# Patient Record
Sex: Female | Born: 1960 | Race: White | Hispanic: No | Marital: Single | Smoking: Never smoker
Health system: Southern US, Community
[De-identification: ages and names within clinical notes are randomized; demographics above are authoritative.]

## PROBLEM LIST (undated history)

## (undated) DIAGNOSIS — M1990 Unspecified osteoarthritis, unspecified site: Secondary | ICD-10-CM

## (undated) DIAGNOSIS — M419 Scoliosis, unspecified: Secondary | ICD-10-CM

## (undated) DIAGNOSIS — J329 Chronic sinusitis, unspecified: Secondary | ICD-10-CM

## (undated) DIAGNOSIS — M8580 Other specified disorders of bone density and structure, unspecified site: Secondary | ICD-10-CM

## (undated) DIAGNOSIS — R87619 Unspecified abnormal cytological findings in specimens from cervix uteri: Secondary | ICD-10-CM

## (undated) DIAGNOSIS — M898X8 Other specified disorders of bone, other site: Secondary | ICD-10-CM

## (undated) DIAGNOSIS — T7840XA Allergy, unspecified, initial encounter: Secondary | ICD-10-CM

## (undated) DIAGNOSIS — G2581 Restless legs syndrome: Secondary | ICD-10-CM

## (undated) DIAGNOSIS — B019 Varicella without complication: Secondary | ICD-10-CM

## (undated) HISTORY — DX: Unspecified abnormal cytological findings in specimens from cervix uteri: R87.619

## (undated) HISTORY — DX: Chronic sinusitis, unspecified: J32.9

## (undated) HISTORY — DX: Unspecified osteoarthritis, unspecified site: M19.90

## (undated) HISTORY — DX: Other specified disorders of bone, other site: M89.8X8

## (undated) HISTORY — DX: Varicella without complication: B01.9

## (undated) HISTORY — DX: Restless legs syndrome: G25.81

## (undated) HISTORY — DX: Scoliosis, unspecified: M41.9

## (undated) HISTORY — DX: Allergy, unspecified, initial encounter: T78.40XA

## (undated) HISTORY — DX: Other specified disorders of bone density and structure, unspecified site: M85.80

---

## 1981-06-11 HISTORY — PX: FINGER FRACTURE SURGERY: SHX638

## 2003-04-22 ENCOUNTER — Ambulatory Visit (HOSPITAL_COMMUNITY): Admission: RE | Admit: 2003-04-22 | Discharge: 2003-04-22 | Payer: Self-pay

## 2003-11-29 ENCOUNTER — Ambulatory Visit (HOSPITAL_COMMUNITY): Admission: RE | Admit: 2003-11-29 | Discharge: 2003-11-29 | Payer: Self-pay | Admitting: Internal Medicine

## 2006-06-11 DIAGNOSIS — R87619 Unspecified abnormal cytological findings in specimens from cervix uteri: Secondary | ICD-10-CM

## 2006-06-11 HISTORY — PX: DILATION AND CURETTAGE OF UTERUS: SHX78

## 2006-06-11 HISTORY — DX: Unspecified abnormal cytological findings in specimens from cervix uteri: R87.619

## 2006-09-19 ENCOUNTER — Ambulatory Visit (HOSPITAL_COMMUNITY): Admission: RE | Admit: 2006-09-19 | Discharge: 2006-09-19 | Payer: Self-pay | Admitting: Internal Medicine

## 2006-11-14 ENCOUNTER — Ambulatory Visit (HOSPITAL_COMMUNITY): Admission: RE | Admit: 2006-11-14 | Discharge: 2006-11-14 | Payer: Self-pay | Admitting: Orthopaedic Surgery

## 2007-12-18 ENCOUNTER — Ambulatory Visit (HOSPITAL_COMMUNITY): Admission: RE | Admit: 2007-12-18 | Discharge: 2007-12-18 | Payer: Self-pay | Admitting: Specialist

## 2007-12-29 ENCOUNTER — Ambulatory Visit (HOSPITAL_COMMUNITY): Admission: RE | Admit: 2007-12-29 | Discharge: 2007-12-29 | Payer: Self-pay | Admitting: Specialist

## 2010-04-17 ENCOUNTER — Emergency Department (HOSPITAL_COMMUNITY): Admission: EM | Admit: 2010-04-17 | Discharge: 2010-04-17 | Payer: Self-pay | Admitting: Emergency Medicine

## 2011-08-30 ENCOUNTER — Other Ambulatory Visit (HOSPITAL_COMMUNITY): Payer: Self-pay | Admitting: Specialist

## 2011-08-30 DIAGNOSIS — Z139 Encounter for screening, unspecified: Secondary | ICD-10-CM

## 2011-09-11 ENCOUNTER — Ambulatory Visit (HOSPITAL_COMMUNITY)
Admission: RE | Admit: 2011-09-11 | Discharge: 2011-09-11 | Disposition: A | Discharge status: Home / Self Care | Payer: BC Managed Care – PPO | Source: Ambulatory Visit | Attending: Specialist | Admitting: Specialist

## 2011-09-11 DIAGNOSIS — Z1231 Encounter for screening mammogram for malignant neoplasm of breast: Secondary | ICD-10-CM | POA: Insufficient documentation

## 2011-09-11 DIAGNOSIS — Z139 Encounter for screening, unspecified: Secondary | ICD-10-CM

## 2012-10-17 ENCOUNTER — Other Ambulatory Visit (HOSPITAL_COMMUNITY): Payer: Self-pay | Admitting: Orthopaedic Surgery

## 2012-10-17 DIAGNOSIS — M545 Low back pain: Secondary | ICD-10-CM

## 2012-10-21 ENCOUNTER — Ambulatory Visit (HOSPITAL_COMMUNITY)
Admission: RE | Admit: 2012-10-21 | Discharge: 2012-10-21 | Disposition: A | Discharge status: Home / Self Care | Payer: BC Managed Care – PPO | Source: Ambulatory Visit | Attending: Orthopaedic Surgery | Admitting: Orthopaedic Surgery

## 2012-10-21 DIAGNOSIS — M5126 Other intervertebral disc displacement, lumbar region: Secondary | ICD-10-CM | POA: Insufficient documentation

## 2012-10-21 DIAGNOSIS — M545 Low back pain, unspecified: Secondary | ICD-10-CM | POA: Insufficient documentation

## 2012-12-04 ENCOUNTER — Other Ambulatory Visit (HOSPITAL_COMMUNITY): Payer: Self-pay | Admitting: Specialist

## 2012-12-04 DIAGNOSIS — Z139 Encounter for screening, unspecified: Secondary | ICD-10-CM

## 2012-12-08 ENCOUNTER — Ambulatory Visit (HOSPITAL_COMMUNITY)
Admission: RE | Admit: 2012-12-08 | Discharge: 2012-12-08 | Disposition: A | Discharge status: Home / Self Care | Payer: BC Managed Care – PPO | Source: Ambulatory Visit | Attending: Specialist | Admitting: Specialist

## 2012-12-08 DIAGNOSIS — Z1231 Encounter for screening mammogram for malignant neoplasm of breast: Secondary | ICD-10-CM | POA: Insufficient documentation

## 2012-12-08 DIAGNOSIS — Z139 Encounter for screening, unspecified: Secondary | ICD-10-CM

## 2013-03-19 ENCOUNTER — Encounter (INDEPENDENT_AMBULATORY_CARE_PROVIDER_SITE_OTHER): Payer: Self-pay

## 2013-03-19 ENCOUNTER — Ambulatory Visit (INDEPENDENT_AMBULATORY_CARE_PROVIDER_SITE_OTHER): Payer: BC Managed Care – PPO | Admitting: Family Medicine

## 2013-03-19 ENCOUNTER — Encounter: Payer: Self-pay | Admitting: Family Medicine

## 2013-03-19 VITALS — BP 110/70 | HR 78 | Temp 98.1°F | Ht 64.0 in | Wt 159.0 lb

## 2013-03-19 DIAGNOSIS — E785 Hyperlipidemia, unspecified: Secondary | ICD-10-CM

## 2013-03-19 DIAGNOSIS — M2550 Pain in unspecified joint: Secondary | ICD-10-CM

## 2013-03-19 DIAGNOSIS — M791 Myalgia, unspecified site: Secondary | ICD-10-CM

## 2013-03-19 DIAGNOSIS — IMO0001 Reserved for inherently not codable concepts without codable children: Secondary | ICD-10-CM

## 2013-03-19 LAB — CBC WITH DIFFERENTIAL/PLATELET
Basophils Absolute: 0.1 10*3/uL (ref 0.0–0.1)
Basophils Relative: 0.8 % (ref 0.0–3.0)
Eosinophils Absolute: 0.3 10*3/uL (ref 0.0–0.7)
Eosinophils Relative: 3.5 % (ref 0.0–5.0)
HCT: 40.8 % (ref 36.0–46.0)
Hemoglobin: 14.1 g/dL (ref 12.0–15.0)
Lymphocytes Relative: 31.8 % (ref 12.0–46.0)
Lymphs Abs: 2.7 10*3/uL (ref 0.7–4.0)
MCHC: 34.5 g/dL (ref 30.0–36.0)
MCV: 89 fl (ref 78.0–100.0)
Monocytes Absolute: 0.7 10*3/uL (ref 0.1–1.0)
Monocytes Relative: 8 % (ref 3.0–12.0)
Neutro Abs: 4.8 10*3/uL (ref 1.4–7.7)
Neutrophils Relative %: 55.9 % (ref 43.0–77.0)
Platelets: 278 10*3/uL (ref 150.0–400.0)
RBC: 4.58 Mil/uL (ref 3.87–5.11)
RDW: 13.1 % (ref 11.5–14.6)
WBC: 8.5 10*3/uL (ref 4.5–10.5)

## 2013-03-19 LAB — LIPID PANEL
Cholesterol: 190 mg/dL (ref 0–200)
HDL: 39.1 mg/dL (ref >39.00)
LDL Cholesterol: 125 mg/dL — ABNORMAL HIGH (ref 0–99)
Total CHOL/HDL Ratio: 5
Triglycerides: 128 mg/dL (ref 0.0–149.0)
VLDL: 25.6 mg/dL (ref 0.0–40.0)

## 2013-03-19 LAB — HEPATIC FUNCTION PANEL
ALT: 17 U/L (ref 0–35)
AST: 16 U/L (ref 0–37)
Albumin: 4.3 g/dL (ref 3.5–5.2)
Alkaline Phosphatase: 72 U/L (ref 39–117)
Bilirubin, Direct: 0 mg/dL (ref 0.0–0.3)
Total Bilirubin: 0.7 mg/dL (ref 0.3–1.2)
Total Protein: 7.4 g/dL (ref 6.0–8.3)

## 2013-03-19 LAB — BASIC METABOLIC PANEL
BUN: 13 mg/dL (ref 6–23)
CO2: 29 mEq/L (ref 19–32)
Calcium: 9.2 mg/dL (ref 8.4–10.5)
Chloride: 104 mEq/L (ref 96–112)
Creatinine, Ser: 0.8 mg/dL (ref 0.4–1.2)
GFR: 79.04 mL/min (ref >60.00)
Glucose, Bld: 87 mg/dL (ref 70–99)
Potassium: 4.5 mEq/L (ref 3.5–5.1)
Sodium: 138 mEq/L (ref 135–145)

## 2013-03-19 LAB — TSH: TSH: 0.99 u[IU]/mL (ref 0.35–5.50)

## 2013-03-19 LAB — SEDIMENTATION RATE: Sed Rate: 23 mm/hr — ABNORMAL HIGH (ref 0–22)

## 2013-03-19 NOTE — Progress Notes (Signed)
  Subjective:    Patient ID: Dana Cook, female    DOB: [birth year], 52 y.o.   MRN: [id number]  HPI Patient seen to establish care. Generally very healthy. She is followed by gynecologist with [facility]. She has not had menstrual period about a year and is maintained currently on low-dose birth control pill. She has in the past taken sertraline and amitriptyline but not regularly for anxiety symptoms She's had previous D&C but no other surgeries.  Family history significant for father with CAD age 47- he also hypertension. Patient is single. She works in education as a Associate Professor. Never smoked. No alcohol. Last tetanus 2011.  Patient relates that she was diagnosed with possible Lyme disease and also possible [facility] spotted fever July 2013. She was treated with prolonged course of antibiotics.  She does complain of some generalized aches over the past 3-4 weeks. She has some arthralgias but these seem to be more localized including left MTP joint and left thumb. No objective inflammation such as warmth or erythema or any visible edema. She denies any recent fever or rash. Has had some increased malaise. Walks regularly for exercise. No exercise intolerance. No chest pain with walking.  Patient requesting lipid panel. She states her lipids have been elevated in the past but we do not have any records.  Past Medical History  Diagnosis Date  . Chicken pox   . Allergy   . Chronic sinusitis    Past Surgical History  Procedure Laterality Date  . Dilation and curettage of uterus  2010    reports that she has never smoked. She does not have any smokeless tobacco history on file. She reports that she does not drink alcohol or use illicit drugs. family history includes Heart disease in her father; Hypertension in her father, mother, and paternal grandmother. Allergies  Allergen Reactions  . Erythromycin       Review of Systems  Constitutional: Positive  for fatigue. Negative for unexpected weight change.  Eyes: Negative for visual disturbance.  Respiratory: Negative for cough, chest tightness, shortness of breath and wheezing.   Cardiovascular: Negative for chest pain, palpitations and leg swelling.  Genitourinary: Negative for dysuria.  Musculoskeletal: Positive for arthralgias and myalgias.  Skin: Negative for rash.  Neurological: Negative for dizziness, seizures, syncope, weakness, light-headedness and headaches.  Hematological: Negative for adenopathy.       Objective:   Physical Exam  Constitutional: She appears well-developed and well-nourished.  HENT:  Right Ear: External ear normal.  Left Ear: External ear normal.  Mouth/Throat: Oropharynx is clear and moist.  Neck: Neck supple. No thyromegaly present.  Cardiovascular: Normal rate and regular rhythm.   Pulmonary/Chest: Effort normal and breath sounds normal. No respiratory distress. She has no wheezes. She has no rales.  Musculoskeletal: She exhibits no edema and no tenderness.          Assessment & Plan:  #1 nonspecific arthralgias and to a lesser extent myalgias. She has asymmetric involvement and suspect this represents some osteoarthritis. No objective evidence for inflammation.  Check CBC and sedimentation rate. Also check TSH with recent fatigue issues to make sure this is not related to her myalgias #2 reported history of positive Lyme disease over one year ago. She was treated with full one month of antibiotics. Send for old labs/records. #3 history of reported hyperlipidemia. Check lipid and hepatic panel

## 2013-04-04 ENCOUNTER — Encounter: Payer: Self-pay | Admitting: Family Medicine

## 2013-04-08 ENCOUNTER — Encounter: Payer: Self-pay | Admitting: Family Medicine

## 2013-04-14 NOTE — Telephone Encounter (Signed)
Was there any hx of tick bite in past 2-14 days?  If not, and if no fever, headache, etc. RMSF titer would not be helpful.  Also, these titers do no help with acute management of RMSF. If she is having multiple symptoms and questions regarding labs I would recommend follow up to discuss.

## 2013-06-11 HISTORY — PX: COLPOSCOPY: SHX161

## 2013-08-25 ENCOUNTER — Telehealth: Payer: Self-pay | Admitting: Family Medicine

## 2013-08-25 DIAGNOSIS — Z1211 Encounter for screening for malignant neoplasm of colon: Secondary | ICD-10-CM

## 2013-08-25 NOTE — Telephone Encounter (Signed)
Pt is wanting to get a referral for a colonoscopy done.

## 2013-08-26 NOTE — Telephone Encounter (Signed)
Referral ordered

## 2013-08-26 NOTE — Telephone Encounter (Signed)
OK to set up referral to [facility]

## 2013-08-28 ENCOUNTER — Encounter: Payer: Self-pay | Admitting: Internal Medicine

## 2013-08-31 ENCOUNTER — Encounter: Payer: Self-pay | Admitting: Gynecology

## 2013-09-10 ENCOUNTER — Telehealth: Payer: Self-pay | Admitting: Family Medicine

## 2013-09-10 NOTE — Telephone Encounter (Signed)
Error/gd °

## 2013-09-18 ENCOUNTER — Ambulatory Visit (AMBULATORY_SURGERY_CENTER): Payer: Self-pay | Admitting: *Deleted

## 2013-09-18 VITALS — Ht 64.0 in | Wt 153.6 lb

## 2013-09-18 DIAGNOSIS — Z1211 Encounter for screening for malignant neoplasm of colon: Secondary | ICD-10-CM

## 2013-09-18 MED ORDER — MOVIPREP 100 G PO SOLR: ORAL | Status: DC

## 2013-09-18 NOTE — Progress Notes (Signed)
No allergies to eggs or soy. No problems with anesthesia.  Pt given Emmi instructions for colonoscopy  

## 2013-09-22 ENCOUNTER — Encounter: Payer: Self-pay | Admitting: Internal Medicine

## 2013-09-25 ENCOUNTER — Encounter: Payer: Self-pay | Admitting: Gynecology

## 2013-10-06 ENCOUNTER — Encounter: Payer: Self-pay | Admitting: Gynecology

## 2013-10-07 ENCOUNTER — Encounter: Payer: Self-pay | Admitting: Gynecology

## 2013-10-07 ENCOUNTER — Ambulatory Visit (INDEPENDENT_AMBULATORY_CARE_PROVIDER_SITE_OTHER): Payer: BC Managed Care – PPO | Admitting: Gynecology

## 2013-10-07 VITALS — BP 98/60 | HR 80 | Resp 18 | Ht 64.0 in | Wt 152.0 lb

## 2013-10-07 DIAGNOSIS — Z01419 Encounter for gynecological examination (general) (routine) without abnormal findings: Secondary | ICD-10-CM

## 2013-10-07 DIAGNOSIS — G4700 Insomnia, unspecified: Secondary | ICD-10-CM | POA: Insufficient documentation

## 2013-10-07 DIAGNOSIS — R87619 Unspecified abnormal cytological findings in specimens from cervix uteri: Secondary | ICD-10-CM | POA: Insufficient documentation

## 2013-10-07 DIAGNOSIS — F411 Generalized anxiety disorder: Secondary | ICD-10-CM

## 2013-10-07 DIAGNOSIS — Z1211 Encounter for screening for malignant neoplasm of colon: Secondary | ICD-10-CM | POA: Insufficient documentation

## 2013-10-07 DIAGNOSIS — Z309 Encounter for contraceptive management, unspecified: Secondary | ICD-10-CM

## 2013-10-07 DIAGNOSIS — Z0000 Encounter for general adult medical examination without abnormal findings: Secondary | ICD-10-CM

## 2013-10-07 LAB — POCT URINALYSIS DIPSTICK
Bilirubin, UA: NEGATIVE
Blood, UA: NEGATIVE
Glucose, UA: NEGATIVE
Ketones, UA: NEGATIVE
Leukocytes, UA: NEGATIVE
Nitrite, UA: NEGATIVE
Protein, UA: NEGATIVE
Urobilinogen, UA: NEGATIVE
pH, UA: 5

## 2013-10-07 MED ORDER — NORETHINDRONE 0.35 MG PO TABS: 1.0000 | ORAL_TABLET | Freq: Every day | ORAL | Status: DC

## 2013-10-07 MED ORDER — AMITRIPTYLINE HCL 10 MG PO TABS: 10.0000 mg | ORAL_TABLET | Freq: Every evening | ORAL | Status: DC | PRN

## 2013-10-07 MED ORDER — SERTRALINE HCL 100 MG PO TABS: 100.0000 mg | ORAL_TABLET | ORAL | Status: DC | PRN

## 2013-10-07 NOTE — Progress Notes (Signed)
53 y.o. Single Caucasian female   G0P0 here for annual exam.  She does not report hot flashes, does not have night sweats, does not have vaginal dryness.  She is not using lubricants.  She does not report post-menopasual bleeding.   Pt is on micronor for contraception since 2013 and does not have cycles on it.  No dyspareunia.  Pt lost 45# at weight watchers and has kept off.  Patient's last menstrual period was 12/10/2011.          Sexually active: yes  The current method of family planning is OCP (estrogen/progesterone).    Exercising: yes  Walking, pt is gym teacher Last pap: 07/2012 Normal Abnormal PAP: yes, 2010 Mammogram: 12/2012 BI-RADS 1: Neg BSE: No Colonoscopy: Next week DEXA: Never Alcohol: No Tobacco: No Labs: PCP UA: Negative/Clear  Health Maintenance  Topic Date Due  . Pap Smear  08/06/1979  . Colonoscopy  08/06/2011  . Influenza Vaccine  01/09/2014  . Mammogram  12/09/2014  . Tetanus/tdap  11/10/2019    Family History  Problem Relation Age of Onset  . Hypertension Mother   . Heart disease Father   . Hypertension Father   . Hypertension Paternal Grandmother   . Colon cancer Neg Hx     Patient Active Problem List   Diagnosis Date Noted  . 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 Chicken pox   . Allergy   . Chronic sinusitis   . Osteoarthritis   . Abnormal Pap smear of cervix 2008  . 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 Dilation and curettage of uterus  2008  . Finger fracture surgery Right 1983    5th finger    Allergies: Erythromycin  Current Outpatient Prescriptions  Medication Sig Dispense Refill  . naproxen sodium (ANAPROX) 220 MG tablet Take 220 mg by mouth daily as needed.      Marland Kitchen NORA-BE 0.35 MG tablet Take 1 tablet by mouth  daily.       . sertraline (ZOLOFT) 100 MG tablet Take 100 mg by mouth as needed.       Marland Kitchen amitriptyline (ELAVIL) 10 MG tablet Take 10 mg by mouth at bedtime as needed for sleep.      Marland Kitchen MOVIPREP 100 G SOLR moviprep as directed. No substitutions  1 kit  0   No current facility-administered medications for this visit.    ROS: Pertinent items are noted in HPI.  Exam:    BP 98/60  Pulse 80  Resp 18  Ht '5\' 4"'  (1.626 m)  Wt 152 lb (68.947 kg)  BMI 26.08 kg/m2  LMP 12/10/2011 Weight change: '@WEIGHTCHANGE' @ Last 3 height recordings:  Ht Readings from Last 3 Encounters:  10/07/13 '5\' 4"'  (1.626 m)  09/18/13 '5\' 4"'  (1.626 m)  03/19/13 '5\' 4"'  (1.626 m)   General appearance: alert, cooperative and appears stated age Head: Normocephalic, without obvious abnormality, atraumatic Neck: no adenopathy, no carotid bruit, no JVD, supple, symmetrical, trachea midline and thyroid not enlarged, symmetric, no tenderness/mass/nodules Lungs: clear to auscultation bilaterally Breasts: normal appearance, no masses or tenderness Heart: regular rate and rhythm, S1, S2 normal, no murmur, click, rub or gallop Abdomen: soft, non-tender; bowel sounds normal; no masses,  no organomegaly Extremities: extremities normal, atraumatic, no cyanosis or edema Skin:  Skin color, texture, turgor normal. No rashes or lesions Lymph nodes: Cervical, supraclavicular, and axillary nodes normal. no inguinal nodes palpated Neurologic: Grossly normal   Pelvic: External genitalia:  no lesions              Urethra: normal appearing urethra with no masses, tenderness or lesions              Bartholins and Skenes: normal                 Vagina: normal appearing vagina with normal color and discharge, no lesions              Cervix: normal appearance              Pap taken: yes        Bimanual Exam:  Uterus:  uterus is normal size, shape, consistency and nontender                                      Adnexa:    normal adnexa in size,  nontender and no masses                                      Rectovaginal: Confirms                                      Anus:  normal sphincter tone, no lesions 1. Routine gynecological examination well woman mammogram counseled on breast self exam, mammography screening, osteoporosis, adequate intake of calcium and vitamin D, diet and exercise 2. Laboratory examination ordered as part of a routine general medical examination  - POCT Urinalysis Dipstick - Vit D  25 hydroxy (rtn osteoporosis monitoring)  3. 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 Insomnia  - amitriptyline (ELAVIL) 10 MG tablet; Take 1 tablet (10 mg total) by mouth at bedtime as needed for sleep.  Dispense: 10 tablet; Refill: 0  5. Contraception management Will check [place] next year - norethindrone (NORA-BE) 0.35 MG tablet; Take 1 tablet (0.35 mg total) by mouth daily.  Dispense: 3 Package; Refill: 3  6. Anxiety state, unspecified Uses prn, adivsed should be taken daily, offered short acting benzodiazepine instead, declines - sertraline (ZOLOFT) 100 MG tablet; Take 1 tablet (100 mg total) by mouth as needed.  Dispense: 30 tablet; Refill: 2     An After Visit Summary was printed and given to the patient.

## 2013-10-07 NOTE — Patient Instructions (Signed)

## 2013-10-08 LAB — VITAMIN D 25 HYDROXY (VIT D DEFICIENCY, FRACTURES): Vit D, 25-Hydroxy: 32 ng/mL (ref 30–89)

## 2013-10-12 ENCOUNTER — Other Ambulatory Visit: Payer: Self-pay | Admitting: Gynecology

## 2013-10-12 DIAGNOSIS — B977 Papillomavirus as the cause of diseases classified elsewhere: Secondary | ICD-10-CM

## 2013-10-12 DIAGNOSIS — IMO0002 Reserved for concepts with insufficient information to code with codable children: Secondary | ICD-10-CM

## 2013-10-12 LAB — IPS PAP TEST WITH HPV

## 2013-10-15 ENCOUNTER — Telehealth: Payer: Self-pay | Admitting: Emergency Medicine

## 2013-10-15 ENCOUNTER — Encounter: Payer: Self-pay | Admitting: Internal Medicine

## 2013-10-15 ENCOUNTER — Ambulatory Visit (AMBULATORY_SURGERY_CENTER): Payer: BC Managed Care – PPO | Admitting: Internal Medicine

## 2013-10-15 VITALS — BP 107/70 | HR 75 | Temp 96.6°F | Resp 30 | Ht 64.0 in | Wt 153.0 lb

## 2013-10-15 DIAGNOSIS — Z1211 Encounter for screening for malignant neoplasm of colon: Secondary | ICD-10-CM

## 2013-10-15 MED ORDER — SODIUM CHLORIDE 0.9 % IV SOLN: 500.0000 mL | INTRAVENOUS | Status: DC

## 2013-10-15 NOTE — Telephone Encounter (Addendum)
Patient on ocp.  PR: $30 Needs Colposcopy.    Message left to return call to [place] at [phone] on home phone number per designated party release form.

## 2013-10-15 NOTE — Patient Instructions (Addendum)
YOU HAD AN ENDOSCOPIC PROCEDURE TODAY AT THE [facility]: Refer to the procedure report that was given to you for any specific questions about what was found during the examination.  If the procedure report does not answer your questions, please call your gastroenterologist to clarify.  If you requested that your care partner not be given the details of your procedure findings, then the procedure report has been included in a sealed envelope for you to review at your convenience later.  YOU SHOULD EXPECT: Some feelings of bloating in the abdomen. Passage of more gas than usual.  Walking can help get rid of the air that was put into your GI tract during the procedure and reduce the bloating. If you had a lower endoscopy (such as a colonoscopy or flexible sigmoidoscopy) you may notice spotting of blood in your stool or on the toilet paper. If you underwent a bowel prep for your procedure, then you may not have a normal bowel movement for a few days.  DIET: Your first meal following the procedure should be a light meal and then it is ok to progress to your normal diet.  A half-sandwich or bowl of soup is an example of a good first meal.  Heavy or fried foods are harder to digest and may make you feel nauseous or bloated.  Likewise meals heavy in dairy and vegetables can cause extra gas to form and this can also increase the bloating.  Drink plenty of fluids but you should avoid alcoholic beverages for 24 hours.  ACTIVITY: Your care partner should take you home directly after the procedure.  You should plan to take it easy, moving slowly for the rest of the day.  You can resume normal activity the day after the procedure however you should NOT DRIVE or use heavy machinery for 24 hours (because of the sedation medicines used during the test).    SYMPTOMS TO REPORT IMMEDIATELY: A gastroenterologist can be reached at any hour.  During normal business hours, 8:30 AM to 5:00 PM Monday through Friday,  call [phone].  After hours and on weekends, please call the GI answering service at [phone] who will take a message and have the physician on call contact you.   Following lower endoscopy (colonoscopy or flexible sigmoidoscopy):  Excessive amounts of blood in the stool  Significant tenderness or worsening of abdominal pains  Swelling of the abdomen that is new, acute  Fever of 100F or higher  FOLLOW UP: If any biopsies were taken you will be contacted by phone or by letter within the next 1-3 weeks.  Call your gastroenterologist if you have not heard about the biopsies in 3 weeks.  Our staff will call the home number listed on your records the next business day following your procedure to check on you and address any questions or concerns that you may have at that time regarding the information given to you following your procedure. This is a courtesy call and so if there is no answer at the home number and we have not heard from you through the emergency physician on call, we will assume that you have returned to your regular daily activities without incident.  SIGNATURES/CONFIDENTIALITY: You and/or your care partner have signed paperwork which will be entered into your electronic medical record.  These signatures attest to the fact that that the information above on your After Visit Summary has been reviewed and is understood.  Full responsibility of the confidentiality of this   discharge information lies with you and/or your care-partner.   You may resume your current medications today. Please call if any questions or concerns. Normal colon today. Recall colonoscopy in 10 years.

## 2013-10-15 NOTE — Telephone Encounter (Signed)
Spoke to patient and patient given message from Dr. Charlies Constable. Patient is agreeable to scheduling colposcopy, explained procedure to patient. Patient requested late evening appointment as Dr. Charlies Constable stated those are available. Patient scheduled for 10/26/13 at 1830. Colposcopy pre-procedure instructions given.Patient will take Aleve prior to procedure advised to Take with food. Make sure to eat a meal before appointment and drink plenty of fluids. Patient verbalized understanding and will call to reschedule if will be on menses or has any concerns regarding pregnancy. Advised will need to cancel within 24 hours or will have $100.00 no show fee placed to account.   Routing to provider for final review. Patient agreeable to disposition. Will close encounter

## 2013-10-15 NOTE — Op Note (Signed)
[place]  [address]. [place], [address]   COLONOSCOPY PROCEDURE REPORT  PATIENT: Dana Cook, Dana Cook  MR#: [id number] BIRTHDATE: 03-11-1961 , 84  yrs. old GENDER: Female ENDOSCOPIST: Eustace Quail, MD REFERRED XB:JYNWG Burchette, M.D. PROCEDURE DATE:  10/15/2013 PROCEDURE:   Colonoscopy, screening First Screening Colonoscopy - Avg.  risk and is 50 yrs.  old or older Yes.  Prior Negative Screening - Now for repeat screening. N/A  History of Adenoma - Now for follow-up colonoscopy & has been > or = to 3 yrs.  N/A  Polyps Removed Today? No.  Recommend repeat exam, <10 yrs? No. ASA CLASS:   Class I INDICATIONS:average risk screening. MEDICATIONS: MAC sedation, administered by CRNA and propofol (Diprivan) 200mg  IV  DESCRIPTION OF PROCEDURE:   After the risks benefits and alternatives of the procedure were thoroughly explained, informed consent was obtained.  A digital rectal exam revealed no abnormalities of the rectum.   The LB [id number]  endoscope was introduced through the anus and advanced to the cecum, which was identified by both the appendix and ileocecal valve. No adverse events experienced.   The quality of the prep was excellent, using MoviPrep  The instrument was then slowly withdrawn as the colon was fully examined.  COLON FINDINGS: A normal appearing cecum, ileocecal valve, and appendiceal orifice were identified.  The ascending, hepatic flexure, transverse, splenic flexure, descending, sigmoid colon and rectum appeared unremarkable.  No polyps or cancers were seen. Retroflexed views revealed no abnormalities. The time to cecum=3 minutes 48 seconds.  Withdrawal time=12 minutes 20 seconds.  The scope was withdrawn and the procedure completed. COMPLICATIONS: There were no complications.  ENDOSCOPIC IMPRESSION: Normal colon  RECOMMENDATIONS: Continue current colorectal screening recommendations for "routine risk" patients with a  repeat colonoscopy in 10 years.   eSigned:  Eustace Quail, MD 10/15/2013 11:35 AM   cc: Carolann Littler, MD, Elveria Rising, MD, and The Patient

## 2013-10-15 NOTE — Progress Notes (Signed)
No complaints noted in the recovery room. Maw   

## 2013-10-15 NOTE — Telephone Encounter (Signed)
Message copied by Michele Mcalpine on Thu Oct 15, 2013 10:48 AM ------      Message from: Elveria Rising      Created: Mon Oct 12, 2013  3:37 PM       H/o AGUS  Years ago, normal eval now with LGSIL, colpo, pls inform, ------

## 2013-10-16 ENCOUNTER — Telehealth: Payer: Self-pay

## 2013-10-16 NOTE — Telephone Encounter (Signed)
Left message on answering machine. 

## 2013-10-26 ENCOUNTER — Encounter: Payer: Self-pay | Admitting: Gynecology

## 2013-10-26 ENCOUNTER — Ambulatory Visit (INDEPENDENT_AMBULATORY_CARE_PROVIDER_SITE_OTHER): Payer: BC Managed Care – PPO | Admitting: Gynecology

## 2013-10-26 VITALS — BP 126/80 | HR 80 | Resp 18 | Ht 64.0 in | Wt 156.0 lb

## 2013-10-26 DIAGNOSIS — B977 Papillomavirus as the cause of diseases classified elsewhere: Secondary | ICD-10-CM

## 2013-10-26 DIAGNOSIS — IMO0002 Reserved for concepts with insufficient information to code with codable children: Secondary | ICD-10-CM

## 2013-10-26 NOTE — Progress Notes (Addendum)
Patient ID: Dana Cook, female   DOB: 10/01/1960, 53 y.o.   MRN: [id number]  No chief complaint on file.   HPI Dana Cook is a 53 y.o. female.  Pt had AGUS in past with F/U negative  HPI Procedure explained and patient's questions were invited and answered.  Consent form signed.    Role of HPV in genesis of SIL discussed with patient, and questions answered.   Indications: Pap smear on April 2015 showed: low-grade squamous intraepithelial neoplasia (LGSIL - encompassing HPV,mild dysplasia,CIN I), +HPV. Previous colposcopy: AGUS and in 2008. Prior cervical treatment: EMB and ECC.  Past Medical History  Diagnosis Date  . Chicken pox   . Allergy   . Chronic sinusitis   . Osteoarthritis   . Abnormal Pap smear of cervix 2008  . 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 Dilation and curettage of uterus  2008  . Finger fracture surgery Right 1983    5th finger    Family History  Problem Relation Age of Onset  . Hypertension Mother   . Heart disease Father   . Hypertension Father   . Hypertension Paternal Grandmother   . Colon cancer Neg Hx     Social History History  Substance Use Topics  . Smoking status: Never Smoker   . Smokeless tobacco: Never Used  . Alcohol Use: No    Allergies  Allergen Reactions  . Erythromycin Nausea And Vomiting    Stomach cramps    Current Outpatient Prescriptions  Medication Sig Dispense Refill  . amitriptyline (ELAVIL) 10 MG tablet Take 1 tablet (10 mg total) by mouth at bedtime as needed for sleep.  10 tablet  0  . naproxen sodium (ANAPROX) 220 MG tablet Take 220 mg by mouth daily as needed.      . norethindrone (NORA-BE) 0.35 MG tablet Take 1 tablet (0.35 mg total) by mouth daily.  3 Package  3  . sertraline (ZOLOFT) 100 MG tablet Take 1 tablet (100 mg total) by mouth as needed.  30 tablet  2    No current facility-administered medications for this visit.    Review of Systems Review of Systems  Last menstrual period 12/10/2011.  Physical Exam Physical Exam  Data Reviewed pap  Assessment    Procedure Details  The risks and benefits of the procedure and Written informed consent obtained. Speculum inserted atraumatically and cervix visualized.  3% acetic acid applied.  Cervix examined using 3.75 and 7.5   X magnification and green filter.      Gross appearance:normal  Squamocolumnar junction seen in entirety: yes   no visible lesions, no mosaicism, no punctation and acetowhite lesion(s) noted at 12 o'clock  cervix swabbed with Lugol's solution, endocervical speculum placed and SCJ visualized - lesion at 11 o'clock   Extent of lesion entirely seen: yes    Speculum placed in vagina and excellent visualization of cervix achieved, cervix swabbed x 3 with acetic acid solution.  Specimens: 6, 12 o'clock  Complications: none.     Plan    Specimens labelled and sent to Pathology. Triage based on results      Blood pressure 126/80, pulse 80, resp. rate 18, height 5\' 4"  (1.626 m), weight 156 lb (70.761 kg), last menstrual period 12/10/2011.    Patient tolerated procedure well.   Plan:  Will base further treatment on Pathology findings.  Post biopsy instructions and AVS given to patient.                Azalia Bilis 10/26/2013, 6:20 PM A. CERVIX, 6:00, BIOPSY: -ENDOCERVICAL GLANDULAR TISSUE WITH CHRONIC CERVICITIS; NEGATIVE FOR EVIDENCE OF DYSPLASIA -SQUAMOUS MUCOSA IS NOT SEEN B. CERVIX, 12:00, BIOPSY: -ENDOCERVICAL GLANDULAR TISSUE WITH CHRONIC CERVICITIS AND FOCAL SQUAMOUS METAPLASIA; NEGATIVE FOR EVIDENCE OF DYSPLASIA -SQUAMOUS MUCOSA IS NOT SEEN   Recall 8

## 2013-10-26 NOTE — Patient Instructions (Signed)

## 2013-10-27 ENCOUNTER — Encounter: Payer: Self-pay | Admitting: Gynecology

## 2013-10-28 NOTE — Addendum Note (Signed)
Addended by: Elveria Rising on: 10/28/2013 07:32 PM   Modules accepted: Orders

## 2013-10-30 ENCOUNTER — Telehealth: Payer: Self-pay | Admitting: Gynecology

## 2013-10-30 NOTE — Telephone Encounter (Signed)
Pt calling to see if her results are in from her biopsy. You can also reach her at [phone]

## 2013-10-30 NOTE — Telephone Encounter (Signed)
Spoke with patient. Advised we do not have results and may be expected on 5/26. She will try [place] back on Tuesday.  Routing to provider for final review. Patient agreeable to disposition. Will close encounter

## 2013-11-03 ENCOUNTER — Telehealth: Payer: Self-pay | Admitting: Gynecology

## 2013-11-03 NOTE — Telephone Encounter (Signed)
Pt calling for results from her last visit.

## 2013-11-03 NOTE — Telephone Encounter (Signed)
Spoke with patient. Advised that results are not available at this time. I apologized that results are not in and advised we would call as soon as they came in. Patient is agreeable.

## 2013-11-04 LAB — IPS OTHER TISSUE BIOPSY

## 2013-11-04 NOTE — Telephone Encounter (Signed)
Patient notified of message from Dr. Charlies Constable. Verbalizes understanding. Advised will need repeat pap smear in one year. She is agreeable and thankful for phone call.   Routing to provider for final review. Patient agreeable to disposition. Will close encounter

## 2013-11-04 NOTE — Telephone Encounter (Signed)
Message copied by Michele Mcalpine on Wed Nov 04, 2013  2:15 PM ------      Message from: Elveria Rising      Created: Wed Nov 04, 2013  2:10 PM       Inform biopsy is benign, chronic inflammation, recall 8 ------

## 2013-11-04 NOTE — Telephone Encounter (Signed)
Results to Dr. Charlies Constable at this time.

## 2013-11-25 ENCOUNTER — Other Ambulatory Visit: Payer: Self-pay | Admitting: Gynecology

## 2013-11-25 DIAGNOSIS — Z1231 Encounter for screening mammogram for malignant neoplasm of breast: Secondary | ICD-10-CM

## 2013-12-01 ENCOUNTER — Ambulatory Visit (INDEPENDENT_AMBULATORY_CARE_PROVIDER_SITE_OTHER): Payer: BC Managed Care – PPO | Admitting: Family Medicine

## 2013-12-01 ENCOUNTER — Encounter: Payer: Self-pay | Admitting: Family Medicine

## 2013-12-01 VITALS — BP 126/82 | HR 85 | Temp 98.6°F | Wt 153.0 lb

## 2013-12-01 DIAGNOSIS — R21 Rash and other nonspecific skin eruption: Secondary | ICD-10-CM

## 2013-12-01 MED ORDER — METHYLPREDNISOLONE ACETATE 80 MG/ML IJ SUSP
80.0000 mg | Freq: Once | INTRAMUSCULAR | Status: AC
Start: 2013-12-01 — End: 2013-12-01
  Administered 2013-12-01: 80 mg via INTRAMUSCULAR

## 2013-12-01 MED ORDER — NYSTATIN 100000 UNIT/GM EX CREA: 1.0000 "application " | TOPICAL_CREAM | Freq: Two times a day (BID) | CUTANEOUS | Status: DC

## 2013-12-01 NOTE — Progress Notes (Signed)
   Subjective:    Patient ID: Dana Cook, female    DOB: Feb 27, 1961, 53 y.o.   MRN: [id number]  Rash Pertinent negatives include no fever.   Patient here for evaluation rash lower stomach area. Present for 3 weeks. She had been on prednisone 6 day course which she took at onset without improvement. She thought this may represent a" heat rash". It is especially at night. She tried Benadryl gel with mild relief of itching. No other generalized rash. She did have some right upper extremity rash weeks ago that did respond to the prednisone.   Review of Systems  Constitutional: Negative for fever and chills.  Skin: Positive for rash.       Objective:   Physical Exam  Constitutional: She appears well-developed and well-nourished. No distress.  Cardiovascular: Normal rate and regular rhythm.   Pulmonary/Chest: Effort normal and breath sounds normal. No respiratory distress. She has no wheezes. She has no rales.  Skin: Rash noted.          Assessment & Plan:  Skin rash. Rash is very inflamed and blanches with pressure. ?allergic vs fungal. She has no vesicles and no pustules. Non tender. Nystatin cream twice daily. Keep area dry as possible. Depo 80 mg IM given.

## 2013-12-01 NOTE — Progress Notes (Signed)
Pre visit review using our clinic review tool, if applicable. No additional management support is needed unless otherwise documented below in the visit note. 

## 2013-12-01 NOTE — Patient Instructions (Signed)
Consider antihistamine such as Claritan, Zyrtec, or Allegra for itching Keep area dry as possible.

## 2013-12-09 ENCOUNTER — Emergency Department (HOSPITAL_COMMUNITY): Payer: BC Managed Care – PPO

## 2013-12-09 ENCOUNTER — Emergency Department (HOSPITAL_COMMUNITY)
Admission: EM | Admit: 2013-12-09 | Discharge: 2013-12-09 | Disposition: A | Discharge status: Home / Self Care | Payer: BC Managed Care – PPO | Attending: Emergency Medicine | Admitting: Emergency Medicine

## 2013-12-09 ENCOUNTER — Encounter (HOSPITAL_COMMUNITY): Payer: Self-pay | Admitting: Emergency Medicine

## 2013-12-09 DIAGNOSIS — Z8781 Personal history of (healed) traumatic fracture: Secondary | ICD-10-CM | POA: Insufficient documentation

## 2013-12-09 DIAGNOSIS — Y9389 Activity, other specified: Secondary | ICD-10-CM | POA: Insufficient documentation

## 2013-12-09 DIAGNOSIS — S61209A Unspecified open wound of unspecified finger without damage to nail, initial encounter: Secondary | ICD-10-CM | POA: Insufficient documentation

## 2013-12-09 DIAGNOSIS — Z8619 Personal history of other infectious and parasitic diseases: Secondary | ICD-10-CM | POA: Insufficient documentation

## 2013-12-09 DIAGNOSIS — Z791 Long term (current) use of non-steroidal anti-inflammatories (NSAID): Secondary | ICD-10-CM | POA: Insufficient documentation

## 2013-12-09 DIAGNOSIS — W28XXXA Contact with powered lawn mower, initial encounter: Secondary | ICD-10-CM | POA: Insufficient documentation

## 2013-12-09 DIAGNOSIS — M1990 Unspecified osteoarthritis, unspecified site: Secondary | ICD-10-CM | POA: Insufficient documentation

## 2013-12-09 DIAGNOSIS — Z79899 Other long term (current) drug therapy: Secondary | ICD-10-CM | POA: Insufficient documentation

## 2013-12-09 DIAGNOSIS — S61218A Laceration without foreign body of other finger without damage to nail, initial encounter: Secondary | ICD-10-CM

## 2013-12-09 DIAGNOSIS — Y929 Unspecified place or not applicable: Secondary | ICD-10-CM | POA: Insufficient documentation

## 2013-12-09 DIAGNOSIS — Z8709 Personal history of other diseases of the respiratory system: Secondary | ICD-10-CM | POA: Insufficient documentation

## 2013-12-09 MED ORDER — POVIDONE-IODINE 10 % EX SOLN
CUTANEOUS | Status: AC
  Filled 2013-12-09: qty 118

## 2013-12-09 MED ORDER — HYDROCODONE-ACETAMINOPHEN 5-325 MG PO TABS: 1.0000 | ORAL_TABLET | Freq: Four times a day (QID) | ORAL | Status: DC | PRN

## 2013-12-09 MED ORDER — LIDOCAINE HCL (PF) 1 % IJ SOLN
10.0000 mL | Freq: Once | INTRAMUSCULAR | Status: AC
  Administered 2013-12-09: 10 mL via INTRADERMAL
  Filled 2013-12-09: qty 10

## 2013-12-09 NOTE — Discharge Instructions (Signed)
Elevate your finger. Take ibuprofen 600 mg 4 times a day for pain. Use the hydrocodone for pain not controlled by the ibuprofen. Keep the laceration clean and dry, do clean daily with soapy washcloth. Use triple antibiotic ointment on the wound. DO NOT USE PEROXIDE. Call Dr Angus Palms office to have him recheck your finger for the numbness.  Return to the ED if the wound gets infected (increased redness, swelling, pain, drains pus, you see a red streak going up your arm). Otherwise the sutures should be removed in 10-12 days.    Laceration Care, Adult A laceration is a cut or lesion that goes through all layers of the skin and into the tissue just beneath the skin. TREATMENT  Some lacerations may not require closure. Some lacerations may not be able to be closed due to an increased risk of infection. It is important to see your caregiver as soon as possible after an injury to minimize the risk of infection and maximize the opportunity for successful closure. If closure is appropriate, pain medicines may be given, if needed. The wound will be cleaned to help prevent infection. Your caregiver will use stitches (sutures), staples, wound glue (adhesive), or skin adhesive strips to repair the laceration. These tools bring the skin edges together to allow for faster healing and a better cosmetic outcome. However, all wounds will heal with a scar. Once the wound has healed, scarring can be minimized by covering the wound with sunscreen during the day for 1 full year. HOME CARE INSTRUCTIONS  For sutures or staples:  Keep the wound clean and dry.  If you were given a bandage (dressing), you should change it at least once a day. Also, change the dressing if it becomes wet or dirty, or as directed by your caregiver.  Wash the wound with soap and water 2 times a day. Rinse the wound off with water to remove all soap. Pat the wound dry with a clean towel.  After cleaning, apply a thin layer of the antibiotic  ointment as recommended by your caregiver. This will help prevent infection and keep the dressing from sticking.  You may shower as usual after the first 24 hours. Do not soak the wound in water until the sutures are removed.  Only take over-the-counter or prescription medicines for pain, discomfort, or fever as directed by your caregiver.  Get your sutures or staples removed as directed by your caregiver. For skin adhesive strips:  Keep the wound clean and dry.  Do not get the skin adhesive strips wet. You may bathe carefully, using caution to keep the wound dry.  If the wound gets wet, pat it dry with a clean towel.  Skin adhesive strips will fall off on their own. You may trim the strips as the wound heals. Do not remove skin adhesive strips that are still stuck to the wound. They will fall off in time. For wound adhesive:  You may briefly wet your wound in the shower or bath. Do not soak or scrub the wound. Do not swim. Avoid periods of heavy perspiration until the skin adhesive has fallen off on its own. After showering or bathing, gently pat the wound dry with a clean towel.  Do not apply liquid medicine, cream medicine, or ointment medicine to your wound while the skin adhesive is in place. This may loosen the film before your wound is healed.  If a dressing is placed over the wound, be careful not to apply tape directly over the  skin adhesive. This may cause the adhesive to be pulled off before the wound is healed.  Avoid prolonged exposure to sunlight or tanning lamps while the skin adhesive is in place. Exposure to ultraviolet light in the first year will darken the scar.  The skin adhesive will usually remain in place for 5 to 10 days, then naturally fall off the skin. Do not pick at the adhesive film. You may need a tetanus shot if:  You cannot remember when you had your last tetanus shot.  You have never had a tetanus shot. If you get a tetanus shot, your arm may swell,  get red, and feel warm to the touch. This is common and not a problem. If you need a tetanus shot and you choose not to have one, there is a rare chance of getting tetanus. Sickness from tetanus can be serious. SEEK MEDICAL CARE IF:   You have redness, swelling, or increasing pain in the wound.  You see a red line that goes away from the wound.  You have yellowish-white fluid (pus) coming from the wound.  You have a fever.  You notice a bad smell coming from the wound or dressing.  Your wound breaks open before or after sutures have been removed.  You notice something coming out of the wound such as wood or glass.  Your wound is on your hand or foot and you cannot move a finger or toe. SEEK IMMEDIATE MEDICAL CARE IF:   Your pain is not controlled with prescribed medicine.  You have severe swelling around the wound causing pain and numbness or a change in color in your arm, hand, leg, or foot.  Your wound splits open and starts bleeding.  You have worsening numbness, weakness, or loss of function of any joint around or beyond the wound.  You develop painful lumps near the wound or on the skin anywhere on your body. MAKE SURE YOU:   Understand these instructions.  Will watch your condition.  Will get help right away if you are not doing well or get worse. Document Released: 05/28/2005 Document Revised: 08/20/2011 Document Reviewed: 11/21/2010 [facility] Patient Information 2015 [place], [place]. This information is not intended to replace advice given to you by your health care provider. Make sure you discuss any questions you have with your health care provider.

## 2013-12-09 NOTE — ED Notes (Signed)
Right index finger laceration from lawn mower accident. Bleeding controlled

## 2013-12-09 NOTE — ED Provider Notes (Signed)
CSN: [id number]     Arrival date & time 12/09/13  1136 History   First MD Initiated Contact with Patient 12/09/13 1329     Chief Complaint  Patient presents with  . Extremity Laceration    right index finger     (Consider location/radiation/quality/duration/timing/severity/associated sxs/prior Treatment) HPI Patient states her lawnmower deck was lop-sided and she was trying to make it level. She states she was pushing hard with her right index finger to level it and something slipped and she got her finger caught between 2 metal pieces. She complains of a laceration to her right index finger. She has some numbness distally. She states she's having difficulty moving the finger. Patient is right-handed.  Last tetanus less than 10 years ago   PCP Dr Elease Hashimoto Orthopedist Dr Durward Fortes  Past Medical History  Diagnosis Date  . Chicken pox   . Allergy   . Chronic sinusitis   . Osteoarthritis   . Abnormal Pap smear of cervix 2008  . Atypical glandular cells of undetermined significance (AGUS) on cervical Pap smear 2008    TWICE, D&C negative   Past Surgical History  Procedure Laterality Date  . Dilation and curettage of uterus  2008  . Finger fracture surgery Right 1983    5th finger   Family History  Problem Relation Age of Onset  . Hypertension Mother   . Heart disease Father   . Hypertension Father   . Hypertension Paternal Grandmother   . Colon cancer Neg Hx    History  Substance Use Topics  . Smoking status: Never Smoker   . Smokeless tobacco: Never Used  . Alcohol Use: No     OB History   Grav Para Term Preterm Abortions TAB SAB Ect Mult Living   0              Review of Systems  All other systems reviewed and are negative.     Allergies  Erythromycin  Home Medications   Prior to Admission medications   Medication Sig Start Date End Date Taking? Authorizing Provider  amitriptyline (ELAVIL) 10 MG tablet Take 1 tablet (10 mg total) by mouth at bedtime  as needed for sleep. 10/07/13  Yes Azalia Bilis, MD  naproxen sodium (ANAPROX) 220 MG tablet Take 220 mg by mouth daily as needed.   Yes Historical Provider, MD  norethindrone (NORA-BE) 0.35 MG tablet Take 1 tablet (0.35 mg total) by mouth daily. 10/07/13  Yes Azalia Bilis, MD  nystatin cream (MYCOSTATIN) Apply 1 application topically 2 (two) times daily. 12/01/13  Yes Eulas Post, MD  sertraline (ZOLOFT) 100 MG tablet Take 1 tablet (100 mg total) by mouth as needed. 10/07/13  Yes Azalia Bilis, MD   BP 119/78  Pulse 82  Resp 18  Ht 5\' 4"  (1.626 m)  Wt 150 lb (68.04 kg)  BMI 25.73 kg/m2  SpO2 99%  LMP 12/10/2011  Vital signs normal   Physical Exam  Nursing note and vitals reviewed. Constitutional: She is oriented to person, place, and time. She appears well-developed and well-nourished.  Non-toxic appearance. She does not appear ill. No distress.  HENT:  Head: Normocephalic and atraumatic.  Right Ear: External ear normal.  Left Ear: External ear normal.  Nose: Nose normal. No mucosal edema or rhinorrhea.  Mouth/Throat: Mucous membranes are normal. No dental abscesses or uvula swelling.  Eyes: Conjunctivae and EOM are normal. Pupils are equal, round, and reactive to light.  Neck: Normal range of motion  and full passive range of motion without pain. Neck supple.  Pulmonary/Chest: Effort normal and breath sounds normal. No respiratory distress. She has no rhonchi. She exhibits no crepitus.  Abdominal: Soft. Normal appearance and bowel sounds are normal. She exhibits no distension. There is no tenderness. There is no rebound and no guarding.  Musculoskeletal: Normal range of motion. She exhibits no edema and no tenderness.  Moves all extremities well.  Has a 1 cm laceration on her dorsal aspect of her distal RIF (see photo).   Neurological: She is alert and oriented to person, place, and time. She has normal strength. No cranial nerve deficit.  Skin: Skin is warm, dry and  intact. No rash noted. No erythema. No pallor.  Psychiatric: She has a normal mood and affect. Her speech is normal and behavior is normal. Her mood appears not anxious.         ED Course  Procedures (including critical care time)  Medications  povidone-iodine (BETADINE) 10 % external solution (not administered)  lidocaine (PF) (XYLOCAINE) 1 % injection 10 mL (10 mLs Intradermal Given by Other 12/09/13 1354)      LACERATION REPAIR Performed by: Rolland Porter L Authorized by: Janice Norrie Consent: Verbal consent obtained. Risks and benefits: risks, benefits and alternatives were discussed Consent given by: patient Patient identity confirmed: provided demographic data Prepped and Draped in normal sterile fashion Wound explored  Laceration Location:  Right index finger volar aspect  Laceration Length: 1 cm  No Foreign Bodies seen or palpated  Anesthesia: local infiltration  Local anesthetic: lidocaine 2%   Anesthetic total: 3 cc  Irrigation method: syringe Amount of cleaning: standard  Inspection of the wound after it was anesthetized shows the wound is just barely through the dermis. She is able to hold flexion at the DIP joint and extension the finger at the DIP joint. There was no debris in the wound.   Skin closure: 4-0 nylon  Number of sutures: 3  Technique: simple interrupted  Patient tolerance: Patient tolerated the procedure well with no immediate complications.    Labs Review Labs Reviewed - No data to display  Imaging Review Dg Finger Index Right  12/09/2013   CLINICAL DATA:  Index finger laceration  EXAM: RIGHT INDEX FINGER 2+V  COMPARISON:  None.  FINDINGS: Three views of the right second finger submitted. No acute fracture or subluxation. No radiopaque foreign body.  IMPRESSION: Negative.   Electronically Signed   By: Lahoma Crocker M.D.   On: 12/09/2013 13:14     EKG Interpretation None      MDM   Final diagnoses:  Laceration of ring finger,  initial encounter    New Prescriptions   HYDROCODONE-ACETAMINOPHEN (NORCO/VICODIN) 5-325 MG PER TABLET    Take 1 tablet by mouth every 6 (six) hours as needed for moderate pain.     Plan discharge  Rolland Porter, MD, Alanson Aly, MD 12/09/13 [phone]

## 2013-12-09 NOTE — ED Notes (Signed)
Suture care at bedside.

## 2013-12-09 NOTE — ED Notes (Signed)
Site dressed with telfa and secured with tape. Pt tolerated well. nad noted.

## 2013-12-09 NOTE — ED Notes (Signed)
Patient given an ice pack per her request.

## 2013-12-10 ENCOUNTER — Ambulatory Visit (HOSPITAL_COMMUNITY)
Admission: RE | Admit: 2013-12-10 | Discharge: 2013-12-10 | Disposition: A | Discharge status: Home / Self Care | Payer: BC Managed Care – PPO | Source: Ambulatory Visit | Attending: Gynecology | Admitting: Gynecology

## 2013-12-10 DIAGNOSIS — Z1231 Encounter for screening mammogram for malignant neoplasm of breast: Secondary | ICD-10-CM | POA: Insufficient documentation

## 2014-09-09 ENCOUNTER — Ambulatory Visit (INDEPENDENT_AMBULATORY_CARE_PROVIDER_SITE_OTHER): Payer: BC Managed Care – PPO | Admitting: Nurse Practitioner

## 2014-09-09 ENCOUNTER — Encounter: Payer: Self-pay | Admitting: Nurse Practitioner

## 2014-09-09 VITALS — BP 110/72 | HR 64 | Ht 63.75 in | Wt 148.0 lb

## 2014-09-09 DIAGNOSIS — F4322 Adjustment disorder with anxiety: Secondary | ICD-10-CM | POA: Diagnosis not present

## 2014-09-09 DIAGNOSIS — Z01419 Encounter for gynecological examination (general) (routine) without abnormal findings: Secondary | ICD-10-CM | POA: Diagnosis not present

## 2014-09-09 DIAGNOSIS — Z30018 Encounter for initial prescription of other contraceptives: Secondary | ICD-10-CM

## 2014-09-09 DIAGNOSIS — Z0000 Encounter for general adult medical examination without abnormal findings: Secondary | ICD-10-CM

## 2014-09-09 DIAGNOSIS — N912 Amenorrhea, unspecified: Secondary | ICD-10-CM | POA: Diagnosis not present

## 2014-09-09 DIAGNOSIS — G4700 Insomnia, unspecified: Secondary | ICD-10-CM | POA: Diagnosis not present

## 2014-09-09 LAB — COMPREHENSIVE METABOLIC PANEL
ALT: 20 U/L (ref 0–35)
AST: 17 U/L (ref 0–37)
Albumin: 4.3 g/dL (ref 3.5–5.2)
Alkaline Phosphatase: 89 U/L (ref 39–117)
BUN: 9 mg/dL (ref 6–23)
CO2: 26 mEq/L (ref 19–32)
Calcium: 9.4 mg/dL (ref 8.4–10.5)
Chloride: 107 mEq/L (ref 96–112)
Creat: 0.75 mg/dL (ref 0.50–1.10)
Glucose, Bld: 80 mg/dL (ref 70–99)
Potassium: 5.1 mEq/L (ref 3.5–5.3)
Sodium: 141 mEq/L (ref 135–145)
Total Bilirubin: 0.5 mg/dL (ref 0.2–1.2)
Total Protein: 6.8 g/dL (ref 6.0–8.3)

## 2014-09-09 LAB — LIPID PANEL
Cholesterol: 175 mg/dL (ref 0–200)
HDL: 37 mg/dL — ABNORMAL LOW (ref >=46)
LDL Cholesterol: 126 mg/dL — ABNORMAL HIGH (ref 0–99)
Total CHOL/HDL Ratio: 4.7 Ratio
Triglycerides: 59 mg/dL (ref <150)
VLDL: 12 mg/dL (ref 0–40)

## 2014-09-09 LAB — POCT URINALYSIS DIPSTICK
Bilirubin, UA: NEGATIVE
Blood, UA: NEGATIVE
Glucose, UA: NEGATIVE
Ketones, UA: NEGATIVE
Leukocytes, UA: NEGATIVE
Nitrite, UA: NEGATIVE
Protein, UA: NEGATIVE
Urobilinogen, UA: NEGATIVE
pH, UA: 7

## 2014-09-09 LAB — TSH: TSH: 0.963 u[IU]/mL (ref 0.350–4.500)

## 2014-09-09 MED ORDER — AMITRIPTYLINE HCL 10 MG PO TABS: 10.0000 mg | ORAL_TABLET | Freq: Every evening | ORAL | Status: DC | PRN

## 2014-09-09 MED ORDER — NORETHINDRONE 0.35 MG PO TABS: 1.0000 | ORAL_TABLET | Freq: Every day | ORAL | Status: DC

## 2014-09-09 MED ORDER — SERTRALINE HCL 100 MG PO TABS: 100.0000 mg | ORAL_TABLET | ORAL | Status: DC | PRN

## 2014-09-09 NOTE — Progress Notes (Signed)
Patient ID: Dana Cook, female   DOB: Aug 04, 1960, 54 y.o.   MRN: [id number] 54 y.o. G0P0 Single  Caucasian Fe here for annual exam.   She is having amenorrhea on POP since 2013.  Per note from last year will check [facility].  Same partner X 4 years.  Patient's last menstrual period was 12/10/2011.          Sexually active: Yes.   Same-sex partner The current method of family planning is oral progesterone-only contraceptive.    Exercising: Yes.    patient is a Art therapist and walking Smoker:  no  Health Maintenance: Pap:  10/07/13, LSIL; Colpo:  10/28/13, Cervicitis History of AGUS in 2010 with D&C MMG:  12/10/13, Bi-Rads 1:  Negative Colonoscopy:  10/15/13, normal, repeat in 10 years BMD:   Dr. Durward Fortes, osteopenia TDaP:  11/09/09  Labs:  HB:  PCP 02/2014  Urine:  Negative    reports that she has never smoked. She has never used smokeless tobacco. She reports that she does not drink alcohol or use illicit drugs.  Past Medical History  Diagnosis Date  . Chicken pox   . Allergy   . Chronic sinusitis   . Osteoarthritis   . Abnormal Pap smear of cervix 2008  . 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 Dilation and curettage of uterus  2008  . Finger fracture surgery Right 1983    5th finger    Current Outpatient Prescriptions  Medication Sig Dispense Refill  . amitriptyline (ELAVIL) 10 MG tablet Take 1 tablet (10 mg total) by mouth at bedtime as needed for sleep. 30 tablet 6  . diclofenac sodium (VOLTAREN) 1 % GEL Apply topically 4 (four) times daily.    . naproxen sodium (ANAPROX) 220 MG tablet Take 220 mg by mouth daily as needed.    . norethindrone (NORA-BE) 0.35 MG tablet Take 1 tablet (0.35 mg total) by mouth daily. 3 Package 3  . nystatin cream (MYCOSTATIN) Apply 1 application topically 2 (two) times daily. 30 g 1  . sertraline (ZOLOFT) 100 MG tablet Take 1  tablet (100 mg total) by mouth as needed. 90 tablet 3   No current facility-administered medications for this visit.    Family History  Problem Relation Age of Onset  . Hypertension Mother   . Heart disease Father   . Hypertension Father   . Hypertension Paternal Grandmother   . Colon cancer Neg Hx     ROS:  Pertinent items are noted in HPI.  Otherwise, a comprehensive ROS was negative.  Exam:   BP 110/72 mmHg  Pulse 64  Ht 5' 3.75" (1.619 m)  Wt 148 lb (67.132 kg)  BMI 25.61 kg/m2  LMP 12/10/2011 Height: 5' 3.75" (161.9 cm) Ht Readings from Last 3 Encounters:  09/09/14 5' 3.75" (1.619 m)  12/09/13 5\' 4"  (1.626 m)  10/26/13 5\' 4"  (1.626 m)    General appearance: alert, cooperative and appears stated age Head: Normocephalic, without obvious abnormality, atraumatic Neck: no adenopathy, supple, symmetrical, trachea midline and thyroid normal to inspection and palpation Lungs: clear to auscultation bilaterally Breasts: normal appearance, no masses or tenderness Heart: regular rate and rhythm Abdomen: soft, non-tender; no masses,  no organomegaly Extremities: extremities normal, atraumatic, no cyanosis or edema Skin: Skin color, texture, turgor normal. No rashes or lesions Lymph nodes: Cervical, supraclavicular, and axillary nodes normal. No  abnormal inguinal nodes palpated Neurologic: Grossly normal   Pelvic: External genitalia:  no lesions              Urethra:  normal appearing urethra with no masses, tenderness or lesions              Bartholin's and Skene's: normal                 Vagina: normal appearing vagina with normal color and discharge, no lesions              Cervix: anteverted              Pap taken: Yes.   Bimanual Exam:  Uterus:  normal size, contour, position, consistency, mobility, non-tender              Adnexa: no mass, fullness, tenderness               Rectovaginal: Confirms               Anus:  normal sphincter tone, no lesions  Chaperone  present:  yes  A:  Well Woman with normal exam  Peri menopausal on POP  History of AGUS pap 2008 & 2010 with D&C  History of LGSIL with chronic cervicitis, CIN I 10/26/13  History of QA and possibly osteopenia  History of insomnia  History of situational anxiety    P:   Reviewed health and wellness pertinent to exam  Pap smear taken today  Mammogram is due 12/2014  Will follow with routine labs and [place]  Refill on Zoloft and Elavil for insomnia and situational anxiety  Refill on POP until results of labs - (prefers My Chart and a phone call for labs.)  Counseled on breast self exam, mammography screening, adequate intake of calcium and vitamin D, diet and exercise, Kegel's exercises return annually or prn  An After Visit Summary was printed and given to the patient.

## 2014-09-09 NOTE — Patient Instructions (Signed)

## 2014-09-09 NOTE — Progress Notes (Signed)
Encounter reviewed by Dr. Brook Silva.  

## 2014-09-10 LAB — VITAMIN D 25 HYDROXY (VIT D DEFICIENCY, FRACTURES): Vit D, 25-Hydroxy: 34 ng/mL (ref 30–100)

## 2014-09-10 LAB — FOLLICLE STIMULATING HORMONE: FSH: 146.7 m[IU]/mL — ABNORMAL HIGH

## 2014-09-13 LAB — IPS PAP TEST WITH HPV

## 2014-10-22 ENCOUNTER — Encounter: Payer: Self-pay | Admitting: Nurse Practitioner

## 2014-10-22 ENCOUNTER — Telehealth: Payer: Self-pay | Admitting: Emergency Medicine

## 2014-10-22 NOTE — Telephone Encounter (Signed)
I think she should come in for appointment to discuss management

## 2014-10-22 NOTE — Telephone Encounter (Signed)
Spoke with patient. Patient states that since she came off her OCP Alinda Sierras Be in March of this year she has been experiencing hot flashes and night sweats. "I am having a lot of trouble sleeping because of all the night sweats. I just do not feel myself. I do not know if I need to be on any hormones." Patient had [facility] checked on 09/09/2014 which was 146.7. Advised patient will speak with Milford Cage, FNP regarding symptoms and return call with further recommendations. Patient is agreeable. Patient would like to know if she needs anything to be sent for her 3D mammogram at [facility]. Advised Milford Cage, FNP has placed an order for 3D screening to [facility]. Patient will call to schedule at this time.

## 2014-10-22 NOTE — Telephone Encounter (Signed)
Patient is having some irregular systoms and wants to talk with nurse. She can be reached at [phone] unitl 3;25 and then @ [phone]

## 2014-10-22 NOTE — Telephone Encounter (Signed)
Patient sent mychart message that requires clinical triage.  Telephone encounter created.

## 2014-10-22 NOTE — Telephone Encounter (Signed)
Chief Complaint  Patient presents with  . Advice Only    Patient sent mychart message that requires clinical triage.  Marland Kitchen Appointment   Mychart message to patient to request she call office during her available times as listed in her message.

## 2014-10-22 NOTE — Telephone Encounter (Signed)
-----   Message -----    From: Danford Bad    Sent: 10/22/2014  8:51 AM EDT      To: Milford Cage, FNP Subject: Visit Follow-Up Question  Dr. Raquel Sarna, For the past 3 weeks, I have been having night sweats that wake me up; have been depressed and am having trouble sleeping. Could this be a result of coming off the birth control? Is there something you can prescribe me or what do you think?  Also, you had wanted me to get a 3-d mammogram when I get my regular one, do they need to know that when I make the appt. or do you have to send something to them verify this. I will be getting it @ [facility] in [place].  Thank you, Jenene Slicker.  I can be reached @ [phone] or [phone] between 11:30 and 12:00 and 2:30-3:30

## 2014-10-25 NOTE — Telephone Encounter (Signed)
Left message to call Kaitlyn at [phone]. 

## 2014-10-26 NOTE — Telephone Encounter (Signed)
Patient returned call.  She is given message from [place].   She is offered office visit to discuss concerns with provider. Patient declines office visit at this time. She states she will wait until school is out and she has an open calendar and monitor symptoms and call back for an appointment. Routing to provider for final review. Patient agreeable to disposition. Will close encounter.    Marland Kitchen

## 2014-11-17 ENCOUNTER — Other Ambulatory Visit: Payer: Self-pay | Admitting: Nurse Practitioner

## 2014-11-17 DIAGNOSIS — Z1231 Encounter for screening mammogram for malignant neoplasm of breast: Secondary | ICD-10-CM

## 2014-11-22 ENCOUNTER — Encounter: Payer: Self-pay | Admitting: Nurse Practitioner

## 2014-12-20 ENCOUNTER — Ambulatory Visit (HOSPITAL_COMMUNITY)
Admission: RE | Admit: 2014-12-20 | Discharge: 2014-12-20 | Disposition: A | Discharge status: Home / Self Care | Payer: BC Managed Care – PPO | Source: Ambulatory Visit | Attending: Nurse Practitioner | Admitting: Nurse Practitioner

## 2014-12-20 DIAGNOSIS — Z1231 Encounter for screening mammogram for malignant neoplasm of breast: Secondary | ICD-10-CM | POA: Diagnosis not present

## 2015-03-17 ENCOUNTER — Encounter: Payer: Self-pay | Admitting: Family Medicine

## 2015-09-27 ENCOUNTER — Ambulatory Visit (INDEPENDENT_AMBULATORY_CARE_PROVIDER_SITE_OTHER): Payer: BC Managed Care – PPO | Admitting: Nurse Practitioner

## 2015-09-27 ENCOUNTER — Encounter: Payer: Self-pay | Admitting: Nurse Practitioner

## 2015-09-27 VITALS — BP 116/74 | HR 64 | Ht 64.0 in | Wt 147.0 lb

## 2015-09-27 DIAGNOSIS — Z0000 Encounter for general adult medical examination without abnormal findings: Secondary | ICD-10-CM | POA: Diagnosis not present

## 2015-09-27 DIAGNOSIS — M8580 Other specified disorders of bone density and structure, unspecified site: Secondary | ICD-10-CM | POA: Diagnosis not present

## 2015-09-27 DIAGNOSIS — Z01419 Encounter for gynecological examination (general) (routine) without abnormal findings: Secondary | ICD-10-CM | POA: Diagnosis not present

## 2015-09-27 DIAGNOSIS — G4700 Insomnia, unspecified: Secondary | ICD-10-CM | POA: Diagnosis not present

## 2015-09-27 LAB — POCT URINALYSIS DIPSTICK
Bilirubin, UA: NEGATIVE
Blood, UA: NEGATIVE
Glucose, UA: NEGATIVE
Ketones, UA: NEGATIVE
Nitrite, UA: NEGATIVE
Protein, UA: NEGATIVE
Urobilinogen, UA: NEGATIVE
pH, UA: 6

## 2015-09-27 LAB — CBC
HCT: 40.2 % (ref 35.0–45.0)
Hemoglobin: 13.4 g/dL (ref 11.7–15.5)
MCH: 29.8 pg (ref 27.0–33.0)
MCHC: 33.3 g/dL (ref 32.0–36.0)
MCV: 89.5 fL (ref 80.0–100.0)
MPV: 9.7 fL (ref 7.5–12.5)
Platelets: 245 10*3/uL (ref 140–400)
RBC: 4.49 MIL/uL (ref 3.80–5.10)
RDW: 13 % (ref 11.0–15.0)
WBC: 5.2 10*3/uL (ref 3.8–10.8)

## 2015-09-27 LAB — TSH: TSH: 1.36 mIU/L

## 2015-09-27 LAB — HEMOGLOBIN, FINGERSTICK: Hemoglobin, fingerstick: 12.7 g/dL (ref 12.0–16.0)

## 2015-09-27 MED ORDER — AMITRIPTYLINE HCL 10 MG PO TABS: 10.0000 mg | ORAL_TABLET | Freq: Every evening | ORAL | Status: DC | PRN

## 2015-09-27 NOTE — Progress Notes (Signed)
Patient ID: Dana Cook, female   DOB: 03-23-1961, 55 y.o.   MRN: [id number]  55 y.o. G0P0 Single Caucasian Fe here for annual exam.  Last FSH was 146.7 09/09/14 and now off POP.  Slight vaso symptoms that are tolerable. No vaginal dryness and currently not SA.   Same partner for 4.6 yrs, he has not retired yet.    Patient's last menstrual period was 12/10/2011 (approximate).          Sexually active: Yes.    The current method of family planning is none.  Exercising: Yes.    walking at least two times per week, working in yard/outside daily. 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 Whitfield, osteopenia TDaP: 11/09/09 Shingles: Not indicated due to age Pneumonia: Not indicated due to age Hep C and HIV: Drawn today-discuss with patient Labs: HB: 12.7   Urine: 2+ leuk's asymptomatic    reports that she has never smoked. She has never used smokeless tobacco. She reports that she does not drink alcohol or use illicit drugs.  Past Medical History  Diagnosis Date  . Chicken pox   . Allergy   . Chronic sinusitis   . Osteoarthritis   . Abnormal Pap smear of cervix 2008  . 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 Dilation and curettage of uterus  2008  . Finger fracture surgery Right 1983    5th finger    Current Outpatient Prescriptions  Medication Sig Dispense Refill  . amitriptyline (ELAVIL) 10 MG tablet Take 1 tablet (10 mg total) by mouth at bedtime as needed for sleep. 30 tablet 12  . diclofenac sodium (VOLTAREN) 1 % GEL Apply topically 4 (four) times daily.    . naproxen sodium (ANAPROX) 220 MG tablet Take 220 mg by mouth daily as needed.      No current facility-administered medications for this visit.    Family History  Problem Relation Age of Onset  . Hypertension Mother   . Heart disease Father   . Hypertension Father   . Hypertension Paternal Grandmother   . Colon cancer Neg Hx     ROS:  Pertinent items are noted in HPI.  Otherwise, a comprehensive ROS was negative.  Exam:   BP 116/74 mmHg  Pulse 64  Ht 5\' 4"  (1.626 m)  Wt 147 lb (66.679 kg)  BMI 25.22 kg/m2  LMP 12/10/2011 (Approximate) Height: 5\' 4"  (162.6 cm) Ht Readings from Last 3 Encounters:  09/27/15 5\' 4"  (1.626 m)  09/09/14 5' 3.75" (1.619 m)  12/09/13 5\' 4"  (1.626 m)    General appearance: alert, cooperative and appears stated age Head: Normocephalic, without obvious abnormality, atraumatic Neck: no adenopathy, supple, symmetrical, trachea midline and thyroid normal to inspection and palpation Lungs: clear to auscultation bilaterally Breasts: normal appearance, no masses or tenderness Heart: regular rate and rhythm Abdomen: soft, non-tender; no masses,  no organomegaly Extremities: extremities normal, atraumatic, no cyanosis or edema Skin: Skin color, texture, turgor normal. No rashes or lesions Lymph nodes: Cervical, supraclavicular, and axillary nodes normal. No abnormal inguinal nodes palpated Neurologic: Grossly normal   Pelvic: External genitalia:  no lesions  Urethra:  normal appearing urethra with no masses, tenderness or lesions              Bartholin's and Skene's: normal                 Vagina: normal appearing vagina with normal color and discharge, no lesions              Cervix: anteverted              Pap taken: Yes.   Bimanual Exam:  Uterus:  normal size, contour, position, consistency, mobility, non-tender              Adnexa: no mass, fullness, tenderness               Rectovaginal: Confirms               Anus:  normal sphincter tone, no lesions  Chaperone present: yes  A:  Well Woman with normal  exam  Peri menopausal on POP History of AGUS pap 2008 & 2010 with D&C History of LGSIL with chronic cervicitis, CIN I 10/26/13 History of OA and possibly osteopenia - will get repeat BMD at [facility] History of insomnia History of situational anxiety - better since retired and now off [employer]  P:   Reviewed health and wellness pertinent to exam  Pap smear as above  Mammogram is due 12/2015 and will get BMD  Follow with labs  Follow with urine C&S - no med's (leave a message on home number and my chart)  Counseled on breast self exam, mammography screening, adequate intake of calcium and vitamin D, diet and exercise, Kegel's exercises return annually or prn  An After Visit Summary was printed and given to the patient.

## 2015-09-27 NOTE — Patient Instructions (Addendum)

## 2015-09-28 LAB — HEPATITIS C ANTIBODY: HCV Ab: NEGATIVE

## 2015-09-28 LAB — LIPID PANEL
Cholesterol: 175 mg/dL (ref 125–200)
HDL: 48 mg/dL (ref >=46)
LDL Cholesterol: 108 mg/dL (ref <130)
Total CHOL/HDL Ratio: 3.6 Ratio (ref <=5.0)
Triglycerides: 96 mg/dL (ref <150)
VLDL: 19 mg/dL (ref <30)

## 2015-09-28 LAB — COMPREHENSIVE METABOLIC PANEL
ALT: 18 U/L (ref 6–29)
AST: 16 U/L (ref 10–35)
Albumin: 4.2 g/dL (ref 3.6–5.1)
Alkaline Phosphatase: 84 U/L (ref 33–130)
BUN: 12 mg/dL (ref 7–25)
CO2: 27 mmol/L (ref 20–31)
Calcium: 9.2 mg/dL (ref 8.6–10.4)
Chloride: 106 mmol/L (ref 98–110)
Creat: 0.79 mg/dL (ref 0.50–1.05)
Glucose, Bld: 82 mg/dL (ref 65–99)
Potassium: 4.2 mmol/L (ref 3.5–5.3)
Sodium: 143 mmol/L (ref 135–146)
Total Bilirubin: 0.4 mg/dL (ref 0.2–1.2)
Total Protein: 6.7 g/dL (ref 6.1–8.1)

## 2015-09-28 LAB — VITAMIN D 25 HYDROXY (VIT D DEFICIENCY, FRACTURES): Vit D, 25-Hydroxy: 25 ng/mL — ABNORMAL LOW (ref 30–100)

## 2015-09-28 LAB — HIV ANTIBODY (ROUTINE TESTING W REFLEX): HIV 1&2 Ab, 4th Generation: NONREACTIVE

## 2015-09-28 NOTE — Progress Notes (Signed)
Reviewed personally.  M. Suzanne Alpa Salvo, MD.  

## 2015-09-29 LAB — IPS PAP TEST WITH HPV

## 2015-09-30 ENCOUNTER — Telehealth: Payer: Self-pay | Admitting: Nurse Practitioner

## 2015-09-30 ENCOUNTER — Other Ambulatory Visit: Payer: Self-pay | Admitting: Nurse Practitioner

## 2015-09-30 LAB — URINE CULTURE: Colony Count: 50000

## 2015-09-30 MED ORDER — NITROFURANTOIN MONOHYD MACRO 100 MG PO CAPS: 100.0000 mg | ORAL_CAPSULE | Freq: Two times a day (BID) | ORAL | Status: DC

## 2015-09-30 NOTE — Telephone Encounter (Signed)
Patient says she would like to know from the nurse what she needs to do after she has taken the antiobiotics for an infection she was diagnosed with. Would prefer a MYchart message because she is hard to catch on the phone.

## 2015-09-30 NOTE — Telephone Encounter (Signed)
Patient has asked that we leave a message on home number and via my chart about test results. She is informed that her urine culture does show an infection and that medication has been sent to the pharmacy.  Also note on My chart.  Pap is normal.

## 2015-09-30 NOTE — Telephone Encounter (Signed)
Mychart message sent to patient as seen below. Patient was seen in the office on 09/27/2015 and treated with Macrobid bid x 7 days for a UTI. Per results note from urine culture dated 09/27/2015 the patient will only need to return to the office if she completes the full 7 day course of Macrobid and is still having urinary symptoms (please see result note from Kem Boroughs, [place]).  Dana Cook,  I received your telephone call with your request for a response via mychart. If you have completed the full 7 day course of your Macrobid antibiotic you do not need to do anything further unless you are still having urinary symptoms.  If your symptoms are persisting after completion of the Macrobid you will need to contact the office to schedule a recheck appointment with Kem Boroughs, FNP.  Sincerely, Rolla Etienne, RN  Routing to provider for final review. Patient agreeable to disposition. Will close encounter.

## 2015-10-03 ENCOUNTER — Telehealth: Payer: Self-pay

## 2015-10-03 NOTE — Telephone Encounter (Signed)
I was not having any symptoms. When I finish the meds how will I know if it is cleared&#[id number]; up since there were no signs of infection. Thanks, Dana Cook     ----- Message -----    From: Nurse Shawn Route    Sent: 09/30/2015 12:43 PM EDT    To: Dana Cook    Subject: treatment question        Dana Cook,        I received your telephone call with your request for a response via mychart. If you have completed the full 7 day course of your Macrobid antibiotic you do not need to do anything further unless you are still having urinary symptoms. If your symptoms are persisting after completion of the Macrobid you will need to contact the office to schedule a recheck appointment with Kem Boroughs, FNP.        Sincerely,    Rolla Etienne, RN    Kem Boroughs, FNP should this patient return for a follow up urine dip and culture?

## 2015-10-04 NOTE — Telephone Encounter (Signed)
Spoke with patient after speaking with Kem Boroughs, FNP regarding patient concerns. Advised per Kem Boroughs, FNP patient may return to the office for a repeat urine check to ensure there is no bacteria present in the urine after completing Macrobid rx. Patient states she will take her last dose of Macrobid on 10/07/2015. Advised we will need to schedule an appointment for a nurse visit for a recheck. She is agreeable. Appointment scheduled for 10/11/2015 at 10:30 am. Patient is agreeable to date and time.  Routing to provider for final review. Patient agreeable to disposition. Will close encounter.

## 2015-10-04 NOTE — Telephone Encounter (Signed)
We went back to look at urine results that day and saw that she had 2+ leuk's only and no RBC.  She does not need a TOC.  On the day of visit I suspected this was contamination since no symptoms and did not treat until urine culture came back.

## 2015-10-11 ENCOUNTER — Ambulatory Visit (INDEPENDENT_AMBULATORY_CARE_PROVIDER_SITE_OTHER): Payer: BC Managed Care – PPO | Admitting: *Deleted

## 2015-10-11 VITALS — BP 110/68 | HR 72 | Temp 98.1°F | Resp 16 | Ht 64.0 in | Wt 147.0 lb

## 2015-10-11 DIAGNOSIS — N390 Urinary tract infection, site not specified: Secondary | ICD-10-CM

## 2015-10-11 LAB — POCT URINALYSIS DIPSTICK
Bilirubin, UA: NEGATIVE
Blood, UA: NEGATIVE
Glucose, UA: NEGATIVE
Ketones, UA: NEGATIVE
Nitrite, UA: NEGATIVE
Protein, UA: NEGATIVE
Urobilinogen, UA: NEGATIVE
pH, UA: 6

## 2015-10-11 NOTE — Progress Notes (Signed)
Patient in today per Dana Cook for recheck of urine. Patient was 2+ leuk's and asymptomatic on 09/27/15. Patient is currently showing trace of leukocytes. Urine culture has been drawn up and placed in the lab as instructed by PG.

## 2015-10-12 LAB — URINE CULTURE: Colony Count: 25000

## 2015-10-13 ENCOUNTER — Telehealth: Payer: Self-pay | Admitting: Certified Nurse Midwife

## 2015-10-13 ENCOUNTER — Telehealth: Payer: Self-pay | Admitting: Nurse Practitioner

## 2015-10-13 NOTE — Telephone Encounter (Signed)
Routing to [employer], CNM  since Chong Sicilian is out of office today. This was a TOC after treatment for staph in urine culture in April. Patient requested the TOC.

## 2015-10-13 NOTE — Telephone Encounter (Signed)
My Chart message sent regarding urine culture

## 2015-10-13 NOTE — Telephone Encounter (Signed)
Patient wanting to speak with Ms. Patty about her urine results. Best # to reach: [phone]

## 2015-10-17 ENCOUNTER — Telehealth: Payer: Self-pay | Admitting: Nurse Practitioner

## 2015-10-17 NOTE — Telephone Encounter (Signed)
Patient has called about urine culture results from last week.  She got the results note but wanted to make sure all is well and no further antibiotics is needed.  She is given information about vaginal contamination and that no further follow up is needed.  She is in agreement as she has no symptoms.

## 2015-12-05 ENCOUNTER — Encounter: Payer: Self-pay | Admitting: Family Medicine

## 2015-12-05 ENCOUNTER — Ambulatory Visit (INDEPENDENT_AMBULATORY_CARE_PROVIDER_SITE_OTHER): Payer: BC Managed Care – PPO | Admitting: Family Medicine

## 2015-12-05 VITALS — BP 100/80 | HR 90 | Temp 98.2°F | Ht 64.0 in | Wt 144.0 lb

## 2015-12-05 DIAGNOSIS — Z0000 Encounter for general adult medical examination without abnormal findings: Secondary | ICD-10-CM | POA: Diagnosis not present

## 2015-12-05 DIAGNOSIS — G2581 Restless legs syndrome: Secondary | ICD-10-CM

## 2015-12-05 NOTE — Progress Notes (Signed)
Subjective:    Patient ID: Dana Cook, female    DOB: 11/21/1960, 55 y.o.   MRN: [id number]  HPI Patient here for physical exam. She recently saw gynecologist had multiple labs. These were mostly normal with exception of low vitamin D level of 25. She is taking over-the-counter vitamin D replacement. She has questions regarding shingles vaccine. She is not sure she has insurance coverage. Other immunizations up-to-date. Colonoscopy up-to-date. She has regular mammograms and Pap smears. Recent HIV and hepatitis C screening negative.  Patient had a couple of tick bites back in April of May. She's not had any symptoms such as headache, fever, arthralgia, skin rash.  Concern for possible restless leg syndrome. The past couple years she has symptoms that occur predominantly at night where her legs feel "jumpy ". She frequently has surface sensation of crawling or mild aching that is relieved with movement such as walking. No caffeine use. No alcohol use. Symptoms are mild to moderate in severity  She reports that both her parents have been diagnosed with restless leg syndrome  Past Medical History  Diagnosis Date  . Chicken pox   . Allergy   . Chronic sinusitis   . Osteoarthritis   . Abnormal Pap smear of cervix 2008  . Atypical glandular cells of undetermined significance (AGUS) on cervical Pap smear 2008    TWICE, D&C negative   Past Surgical History  Procedure Laterality Date  . Dilation and curettage of uterus  2008  . Finger fracture surgery Right 1983    5th finger    reports that she has never smoked. She has never used smokeless tobacco. She reports that she does not drink alcohol or use illicit drugs. family history includes Heart disease in her father; Hypertension in her father, mother, and paternal grandmother. There is no history of Colon cancer. Allergies  Allergen Reactions  . Erythromycin Nausea And Vomiting    Stomach cramps       Review of Systems    Constitutional: Negative for fever, activity change, appetite change, fatigue and unexpected weight change.  HENT: Negative for ear pain, hearing loss, sore throat and trouble swallowing.   Eyes: Negative for visual disturbance.  Respiratory: Negative for cough and shortness of breath.   Cardiovascular: Negative for chest pain and palpitations.  Gastrointestinal: Negative for abdominal pain, diarrhea, constipation and blood in stool.  Genitourinary: Negative for dysuria and hematuria.  Musculoskeletal: Negative for myalgias, back pain and arthralgias.  Skin: Negative for rash.  Neurological: Negative for dizziness, syncope, weakness, numbness and headaches.  Hematological: Negative for adenopathy.  Psychiatric/Behavioral: Positive for sleep disturbance. Negative for confusion and dysphoric mood.       Objective:   Physical Exam  Constitutional: She is oriented to person, place, and time. She appears well-developed and well-nourished.  HENT:  Head: Normocephalic and atraumatic.  Eyes: EOM are normal. Pupils are equal, round, and reactive to light.  Neck: Normal range of motion. Neck supple. No thyromegaly present.  Cardiovascular: Normal rate, regular rhythm and normal heart sounds.   No murmur heard. Pulmonary/Chest: Breath sounds normal. No respiratory distress. She has no wheezes. She has no rales.  Abdominal: Soft. Bowel sounds are normal. She exhibits no distension and no mass. There is no tenderness. There is no rebound and no guarding.  Genitourinary:  Per GYN  Musculoskeletal: Normal range of motion. She exhibits no edema.  Lymphadenopathy:    She has no cervical adenopathy.  Neurological: She is alert and oriented to  person, place, and time. She displays normal reflexes. No cranial nerve deficit.  Skin: No rash noted.  Psychiatric: She has a normal mood and affect. Her behavior is normal. Judgment and thought content normal.          Assessment & Plan:  Physical  exam. Patient will check on coverage for shingles vaccine. Prescription written if this is covered. Other immunizations up-to-date. She'll continue with regular GYN follow-up. We reviewed her multiple recent labs and no concerns other than slightly low vitamin D levels on over-the-counter replacement  Probable restless leg syndrome. She does not have any history of anemia and no caffeine or alcohol use. We discussed possible medication options.  At this point she wishes to observe. Consider trial of Requip or Mirapex if symptoms worsen  Eulas Post MD [facility]

## 2015-12-05 NOTE — Patient Instructions (Signed)
Restless Legs Syndrome Restless legs syndrome is a condition that causes uncomfortable feelings or sensations in the legs, especially while sitting or lying down. The sensations usually cause an overwhelming urge to move the legs. The arms can also sometimes be affected. The condition can range from mild to severe. The symptoms often interfere with a person's ability to sleep. CAUSES The cause of this condition is not known. RISK FACTORS This condition is more likely to develop in:  People who are older than age 45.  Pregnant women. In general, restless legs syndrome is more common in women than in men.  People who have a family history of the condition.  People who have certain medical conditions, such as iron deficiency, kidney disease, Parkinson disease, or nerve damage.  People who take certain medicines, such as medicines for high blood pressure, nausea, colds, allergies, depression, and some heart conditions. SYMPTOMS The main symptom of this condition is uncomfortable sensations in the legs. These sensations may be:  Described as pulling, tingling, prickling, throbbing, crawling, or burning.  Worse while you are sitting or lying down.  Worse during periods of rest or inactivity.  Worse at night, often interfering with your sleep.  Accompanied by a very strong urge to move your legs.  Temporarily relieved by movement of your legs. The sensations usually affect both sides of the body. The arms can also be affected, but this is rare. People who have this condition often have tiredness during the day because of their lack of sleep at night. DIAGNOSIS This condition may be diagnosed based on your description of the symptoms. You may also have tests, including blood tests, to check for other conditions that may lead to your symptoms. In some cases, you may be asked to spend some time in a sleep lab so your sleeping can be monitored. TREATMENT Treatment for this condition is  focused on managing the symptoms. Treatment may include:  Self-help and lifestyle changes.  Medicines. HOME CARE INSTRUCTIONS  Take medicines only as directed by your health care provider.  Try these methods to get temporary relief from the uncomfortable sensations:  Massage your legs.  Walk or stretch.  Take a cold or hot bath.  Practice good sleep habits. For example, go to bed and get up at the same time every day.  Exercise regularly.  Practice ways of relaxing, such as yoga or meditation.  Avoid caffeine and alcohol.  Do not use any tobacco products, including cigarettes, chewing tobacco, or electronic cigarettes. If you need help quitting, ask your health care provider.  Keep all follow-up visits as directed by your health care provider. This is important. SEEK MEDICAL CARE IF: Your symptoms do not improve with treatment, or they get worse.   This information is not intended to replace advice given to you by your health care provider. Make sure you discuss any questions you have with your health care provider.   Document Released: 05/18/2002 Document Revised: 10/12/2014 Document Reviewed: 05/24/2014 Elsevier Interactive Patient Education 2016 [employer].  Drugs frequently used to treat restless legs: Mirapex and Requip

## 2015-12-05 NOTE — Progress Notes (Signed)
Pre visit review using our clinic review tool, if applicable. No additional management support is needed unless otherwise documented below in the visit note. 

## 2015-12-07 ENCOUNTER — Other Ambulatory Visit: Payer: Self-pay | Admitting: Nurse Practitioner

## 2015-12-07 DIAGNOSIS — Z1231 Encounter for screening mammogram for malignant neoplasm of breast: Secondary | ICD-10-CM

## 2015-12-27 ENCOUNTER — Telehealth: Payer: Self-pay | Admitting: Family Medicine

## 2015-12-27 DIAGNOSIS — W57XXXA Bitten or stung by nonvenomous insect and other nonvenomous arthropods, initial encounter: Secondary | ICD-10-CM

## 2015-12-27 NOTE — Telephone Encounter (Signed)
Go ahead with Lyme antibodies testing.  If she has any persistent rash would recommend follow up to evaluate.

## 2015-12-27 NOTE — Telephone Encounter (Signed)
Pt states she has now started developing the symptom from a tick bite. Pt states a rash on her leg, fatigue and joint pain have all started. Pt was bitten in June and advised Dr Elease Hashimoto at the time. Pt want to know if she can have th labs associated with a tick bite,  and request to have at [facility] since she lives in [place].

## 2015-12-27 NOTE — Telephone Encounter (Signed)
We last saw pt on 12/05/2015 for CPE. Please review.

## 2015-12-28 NOTE — Telephone Encounter (Signed)
Order entered

## 2015-12-28 NOTE — Telephone Encounter (Signed)
Order has been entered. Pt is aware via voicemail that [name] should be able to see the order and draw labs. Will call her when we get results back.

## 2015-12-29 ENCOUNTER — Other Ambulatory Visit (HOSPITAL_COMMUNITY)
Admission: RE | Admit: 2015-12-29 | Discharge: 2015-12-29 | Disposition: A | Discharge status: Home / Self Care | Payer: BC Managed Care – PPO | Source: Ambulatory Visit | Attending: Family Medicine | Admitting: Family Medicine

## 2015-12-29 ENCOUNTER — Ambulatory Visit (HOSPITAL_COMMUNITY)
Admission: RE | Admit: 2015-12-29 | Discharge: 2015-12-29 | Disposition: A | Discharge status: Home / Self Care | Payer: BC Managed Care – PPO | Source: Ambulatory Visit | Attending: Nurse Practitioner | Admitting: Nurse Practitioner

## 2015-12-29 DIAGNOSIS — T148 Other injury of unspecified body region: Secondary | ICD-10-CM | POA: Diagnosis not present

## 2015-12-29 DIAGNOSIS — W57XXXA Bitten or stung by nonvenomous insect and other nonvenomous arthropods, initial encounter: Secondary | ICD-10-CM | POA: Diagnosis not present

## 2015-12-29 DIAGNOSIS — Z1231 Encounter for screening mammogram for malignant neoplasm of breast: Secondary | ICD-10-CM | POA: Diagnosis not present

## 2015-12-30 ENCOUNTER — Encounter: Payer: Self-pay | Admitting: Family Medicine

## 2015-12-30 LAB — B. BURGDORFI ANTIBODIES: B burgdorferi Ab IgG+IgM: <0.91 {ISR} (ref 0.00–0.90)

## 2016-01-05 ENCOUNTER — Ambulatory Visit (HOSPITAL_COMMUNITY)
Admission: RE | Admit: 2016-01-05 | Discharge: 2016-01-05 | Disposition: A | Discharge status: Home / Self Care | Payer: BC Managed Care – PPO | Source: Ambulatory Visit | Attending: Nurse Practitioner | Admitting: Nurse Practitioner

## 2016-01-05 DIAGNOSIS — M8580 Other specified disorders of bone density and structure, unspecified site: Secondary | ICD-10-CM | POA: Diagnosis present

## 2016-01-05 DIAGNOSIS — M8588 Other specified disorders of bone density and structure, other site: Secondary | ICD-10-CM | POA: Diagnosis not present

## 2016-05-02 ENCOUNTER — Telehealth (INDEPENDENT_AMBULATORY_CARE_PROVIDER_SITE_OTHER): Payer: Self-pay | Admitting: Physical Medicine and Rehabilitation

## 2016-05-03 NOTE — Telephone Encounter (Signed)
repeat

## 2016-05-07 NOTE — Telephone Encounter (Signed)
Active [employer] coverage/eff 0000000 and no precert required

## 2016-05-15 ENCOUNTER — Ambulatory Visit (INDEPENDENT_AMBULATORY_CARE_PROVIDER_SITE_OTHER): Payer: BC Managed Care – PPO | Admitting: Physical Medicine and Rehabilitation

## 2016-05-15 ENCOUNTER — Encounter (INDEPENDENT_AMBULATORY_CARE_PROVIDER_SITE_OTHER): Payer: Self-pay | Admitting: Physical Medicine and Rehabilitation

## 2016-05-15 VITALS — BP 105/70 | HR 82

## 2016-05-15 DIAGNOSIS — M419 Scoliosis, unspecified: Secondary | ICD-10-CM

## 2016-05-15 DIAGNOSIS — M5416 Radiculopathy, lumbar region: Secondary | ICD-10-CM

## 2016-05-15 MED ORDER — METHYLPREDNISOLONE ACETATE 80 MG/ML IJ SUSP
80.0000 mg | Freq: Once | INTRAMUSCULAR | Status: AC
  Administered 2016-05-15: 80 mg

## 2016-05-15 MED ORDER — LIDOCAINE HCL (PF) 1 % IJ SOLN
0.3300 mL | Freq: Once | INTRAMUSCULAR | Status: AC
  Administered 2016-05-15: 0.3 mL

## 2016-05-15 NOTE — Patient Instructions (Signed)

## 2016-05-15 NOTE — Progress Notes (Signed)
Dana OSSMAN - 55 y.o. female MRN [id number]  Date of birth: 06/26/1960  Office Visit Note: Visit Date: 05/15/2016 PCP: Eulas Post, MD Referred by: Eulas Post, MD  Subjective: Chief Complaint  Patient presents with  . Lower Back - Pain   HPI: HPI [name] is a 55 year old female who did well with last injection until around 1st of November. Last injection was in September and it was a right L5 transforaminal epidural injection. Pain right sided low back and into buttock with walking and doing yardwork. Does not radiate down leg. Legs tingling and jumpy at night.    ROS Otherwise per HPI.  Assessment & Plan: Visit Diagnoses:  1. Lumbar radiculopathy   2. Scoliosis of lumbosacral spine, unspecified scoliosis type     Plan: Findings:  Repeat right L5 transforaminal epidural steroid injection for collapsing scoliosis and facet degenerative change with foraminal narrowing and lateral recess narrowing around the L5 nerve root.    Meds & Orders:  Meds ordered this encounter  Medications  . lidocaine (PF) (XYLOCAINE) 1 % injection 0.3 mL  . methylPREDNISolone acetate (DEPO-MEDROL) injection 80 mg    Orders Placed This Encounter  Procedures  . Epidural Steroid injection    Follow-up: Return if symptoms worsen or fail to improve in 2 weeks.   Procedures: No procedures performed  Lumbosacral Transforaminal Epidural Steroid Injection - Infraneural Approach with Fluoroscopic Guidance  Patient: Dana Cook      Date of Birth: Jul 06, 1960 MRN: [id number] PCP: Eulas Post, MD      Visit Date: 05/15/2016   Universal Protocol:    Date/Time: 12/05/172:13 PM  Consent Given By: the patient  Position: PRONE   Additional Comments: Vital signs were monitored before and after the procedure. Patient was prepped and draped in the usual sterile fashion. The correct patient, procedure, and site was verified.   Injection Procedure Details:    Procedure Site One Meds Administered:  Meds ordered this encounter  Medications  . lidocaine (PF) (XYLOCAINE) 1 % injection 0.3 mL  . methylPREDNISolone acetate (DEPO-MEDROL) injection 80 mg      Laterality: Right  Location/Site:  L5-S1  Needle size: 22 G  Needle type: Spinal  Needle Placement: Transforaminal  Findings:  -Contrast Used: 1 mL iohexol 180 mg iodine/mL   -Comments: Excellent flow of contrast along the nerve and into the epidural space.  Procedure Details: After squaring off the end-plates of the desired vertebral level to get a true AP view, the C-arm was obliqued to the painful side so that the superior articulating process is positioned about 1/3 the length of the inferior endplate.  The needle was aimed toward the junction of the superior articular process and the transverse process of the inferior vertebrae. The needle's initial entry is in the lower third of the foramen through Kambin's triangle. The soft tissues overlying this target were infiltrated with 2-3 ml. of 1% Lidocaine without Epinephrine.  The spinal needle was then inserted and advanced toward the target using a "trajectory" view along the fluoroscope beam.  Under AP and lateral visualization, the needle was advanced so it did not puncture dura and did not traverse medially beyond the 6 o'clock position of the pedicle. Bi-planar projections were used to confirm position. Aspiration was confirmed to be negative for CSF and/or blood. A 1-2 ml. volume of Isovue-250 was injected and flow of contrast was noted at each level. Radiographs were obtained for documentation purposes.   After attaining the desired flow  of contrast documented above, a 0.5 to 1.0 ml test dose of 0.25% Marcaine was injected into each respective transforaminal space.  The patient was observed for 90 seconds post injection.  After no sensory deficits were reported, and normal lower extremity motor function was noted,   the above injectate  was administered so that equal amounts of the injectate were placed at each foramen (level) into the transforaminal epidural space.   Additional Comments:  The patient tolerated the procedure well Dressing: Band-Aid    Post-procedure details: Patient was observed during the procedure. Post-procedure instructions were reviewed.  Patient left the clinic in stable condition.   Clinical History: No specialty comments available.  She reports that she has never smoked. She has never used smokeless tobacco. No results for input(s): HGBA1C, LABURIC in the last 8760 hours.  Objective:  VS:  HT:    WT:   BMI:     BP:105/70  HR:82bpm  TEMP: ( )  RESP:99 % Physical Exam  Musculoskeletal:  The patient ambulates without aid she has no pain over the greater trochanter she has good distal strength.    Ortho Exam Imaging: No results found.  Past Medical/Family/Surgical/Social History: Medications & Allergies reviewed per EMR Patient Active Problem List   Diagnosis Date Noted  . RLS (restless legs syndrome) 12/05/2015  . Contraception management 10/07/2013  . Insomnia 10/07/2013  . Atypical glandular cells of undetermined significance (AGUS) on cervical Pap smear    Past Medical History:  Diagnosis Date  . Abnormal Pap smear of cervix 2008  . Allergy   . Atypical glandular cells of undetermined significance (AGUS) on cervical Pap smear 2008   TWICE, D&C negative  . Chicken pox   . Chronic sinusitis   . Osteoarthritis    Family History  Problem Relation Age of Onset  . Hypertension Mother   . Heart disease Father   . Hypertension Father   . Hypertension Paternal Grandmother   . Colon cancer Neg Hx    Past Surgical History:  Procedure Laterality Date  . DILATION AND CURETTAGE OF UTERUS  2008  . FINGER FRACTURE SURGERY Right 1983   5th finger   Social History   Occupational History  . Not on file.   Social History Main Topics  . Smoking status: Never Smoker  .  Smokeless tobacco: Never Used  . Alcohol use No  . Drug use: No  . Sexual activity: Not Currently    Partners: Male

## 2016-05-15 NOTE — Procedures (Signed)
Lumbosacral Transforaminal Epidural Steroid Injection - Infraneural Approach with Fluoroscopic Guidance  Patient: Dana Cook      Date of Birth: Nov 01, 1960 MRN: [id number] PCP: Eulas Post, MD      Visit Date: 05/15/2016   Universal Protocol:    Date/Time: 12/05/172:13 PM  Consent Given By: the patient  Position: PRONE   Additional Comments: Vital signs were monitored before and after the procedure. Patient was prepped and draped in the usual sterile fashion. The correct patient, procedure, and site was verified.   Injection Procedure Details:  Procedure Site One Meds Administered:  Meds ordered this encounter  Medications  . lidocaine (PF) (XYLOCAINE) 1 % injection 0.3 mL  . methylPREDNISolone acetate (DEPO-MEDROL) injection 80 mg      Laterality: Right  Location/Site:  L5-S1  Needle size: 22 G  Needle type: Spinal  Needle Placement: Transforaminal  Findings:  -Contrast Used: 1 mL iohexol 180 mg iodine/mL   -Comments: Excellent flow of contrast along the nerve and into the epidural space.  Procedure Details: After squaring off the end-plates of the desired vertebral level to get a true AP view, the C-arm was obliqued to the painful side so that the superior articulating process is positioned about 1/3 the length of the inferior endplate.  The needle was aimed toward the junction of the superior articular process and the transverse process of the inferior vertebrae. The needle's initial entry is in the lower third of the foramen through Kambin's triangle. The soft tissues overlying this target were infiltrated with 2-3 ml. of 1% Lidocaine without Epinephrine.  The spinal needle was then inserted and advanced toward the target using a "trajectory" view along the fluoroscope beam.  Under AP and lateral visualization, the needle was advanced so it did not puncture dura and did not traverse medially beyond the 6 o'clock position of the pedicle. Bi-planar  projections were used to confirm position. Aspiration was confirmed to be negative for CSF and/or blood. A 1-2 ml. volume of Isovue-250 was injected and flow of contrast was noted at each level. Radiographs were obtained for documentation purposes.   After attaining the desired flow of contrast documented above, a 0.5 to 1.0 ml test dose of 0.25% Marcaine was injected into each respective transforaminal space.  The patient was observed for 90 seconds post injection.  After no sensory deficits were reported, and normal lower extremity motor function was noted,   the above injectate was administered so that equal amounts of the injectate were placed at each foramen (level) into the transforaminal epidural space.   Additional Comments:  The patient tolerated the procedure well Dressing: Band-Aid    Post-procedure details: Patient was observed during the procedure. Post-procedure instructions were reviewed.  Patient left the clinic in stable condition.

## 2016-05-16 ENCOUNTER — Encounter (INDEPENDENT_AMBULATORY_CARE_PROVIDER_SITE_OTHER): Payer: BC Managed Care – PPO | Admitting: Physical Medicine and Rehabilitation

## 2016-08-22 ENCOUNTER — Telehealth: Payer: Self-pay | Admitting: Nurse Practitioner

## 2016-08-22 DIAGNOSIS — Z0000 Encounter for general adult medical examination without abnormal findings: Secondary | ICD-10-CM

## 2016-08-22 NOTE — Telephone Encounter (Signed)
Labs are ordered and will close chart.

## 2016-08-22 NOTE — Telephone Encounter (Signed)
Kem Boroughs, FNP okay to place order for CBC, CMP, Vitamin D, TSH, and lipid panel to [facility]?

## 2016-08-22 NOTE — Telephone Encounter (Signed)
Patient would like orders for fasting blood work fax to [facility] so she can have done before her appointment with Patty on 10/01/16.

## 2016-08-22 NOTE — Telephone Encounter (Signed)
Future lab orders placed in EPIC. Paper order for labs to Kem Boroughs, FNP for review and signature for faxing to [facility].

## 2016-08-22 NOTE — Telephone Encounter (Signed)
Yes she may get these labs done prior to her exam in April.

## 2016-08-23 NOTE — Telephone Encounter (Signed)
Paper order faxed with cover sheet and confirmation to [facility] at [phone]. Patient  Has been notified orders have been sent and she may have labs drawn before her appointment on 10/01/2016 with Kem Boroughs, FNP.  Will close encounter.

## 2016-09-03 ENCOUNTER — Other Ambulatory Visit (HOSPITAL_COMMUNITY)
Admission: RE | Admit: 2016-09-03 | Discharge: 2016-09-03 | Disposition: A | Discharge status: Home / Self Care | Payer: BC Managed Care – PPO | Source: Ambulatory Visit | Attending: Nurse Practitioner | Admitting: Nurse Practitioner

## 2016-09-03 DIAGNOSIS — Z0000 Encounter for general adult medical examination without abnormal findings: Secondary | ICD-10-CM | POA: Diagnosis not present

## 2016-09-03 LAB — COMPREHENSIVE METABOLIC PANEL
ALT: 22 U/L (ref 14–54)
AST: 17 U/L (ref 15–41)
Albumin: 4 g/dL (ref 3.5–5.0)
Alkaline Phosphatase: 76 U/L (ref 38–126)
Anion gap: 7 (ref 5–15)
BUN: 14 mg/dL (ref 6–20)
CO2: 27 mmol/L (ref 22–32)
Calcium: 9.3 mg/dL (ref 8.9–10.3)
Chloride: 106 mmol/L (ref 101–111)
Creatinine, Ser: 0.68 mg/dL (ref 0.44–1.00)
GFR calc Af Amer: >60 mL/min (ref >60)
GFR calc non Af Amer: >60 mL/min (ref >60)
Glucose, Bld: 94 mg/dL (ref 65–99)
Potassium: 3.8 mmol/L (ref 3.5–5.1)
Sodium: 140 mmol/L (ref 135–145)
Total Bilirubin: 0.5 mg/dL (ref 0.3–1.2)
Total Protein: 6.9 g/dL (ref 6.5–8.1)

## 2016-09-03 LAB — CBC
HCT: 36.5 % (ref 36.0–46.0)
Hemoglobin: 12.8 g/dL (ref 12.0–15.0)
MCH: 30.5 pg (ref 26.0–34.0)
MCHC: 35.1 g/dL (ref 30.0–36.0)
MCV: 86.9 fL (ref 78.0–100.0)
Platelets: 230 10*3/uL (ref 150–400)
RBC: 4.2 MIL/uL (ref 3.87–5.11)
RDW: 12.8 % (ref 11.5–15.5)
WBC: 4.4 10*3/uL (ref 4.0–10.5)

## 2016-09-03 LAB — LIPID PANEL
Cholesterol: 178 mg/dL (ref 0–200)
HDL: 43 mg/dL (ref >40)
LDL Cholesterol: 119 mg/dL — ABNORMAL HIGH (ref 0–99)
Total CHOL/HDL Ratio: 4.1 RATIO
Triglycerides: 79 mg/dL (ref <150)
VLDL: 16 mg/dL (ref 0–40)

## 2016-09-03 LAB — TSH: TSH: 1.64 u[IU]/mL (ref 0.350–4.500)

## 2016-09-04 LAB — VITAMIN D 25 HYDROXY (VIT D DEFICIENCY, FRACTURES): Vit D, 25-Hydroxy: 35.2 ng/mL (ref 30.0–100.0)

## 2016-09-13 ENCOUNTER — Telehealth (INDEPENDENT_AMBULATORY_CARE_PROVIDER_SITE_OTHER): Payer: Self-pay | Admitting: Physical Medicine and Rehabilitation

## 2016-09-13 NOTE — Telephone Encounter (Signed)
Ok to repeat for now

## 2016-09-13 NOTE — Telephone Encounter (Signed)
Scheduled for 09/26/16 at 1330.

## 2016-09-26 ENCOUNTER — Ambulatory Visit (INDEPENDENT_AMBULATORY_CARE_PROVIDER_SITE_OTHER): Payer: BC Managed Care – PPO

## 2016-09-26 ENCOUNTER — Encounter (INDEPENDENT_AMBULATORY_CARE_PROVIDER_SITE_OTHER): Payer: Self-pay | Admitting: Physical Medicine and Rehabilitation

## 2016-09-26 ENCOUNTER — Ambulatory Visit (INDEPENDENT_AMBULATORY_CARE_PROVIDER_SITE_OTHER): Payer: BC Managed Care – PPO | Admitting: Physical Medicine and Rehabilitation

## 2016-09-26 VITALS — BP 113/77 | HR 72

## 2016-09-26 DIAGNOSIS — M5416 Radiculopathy, lumbar region: Secondary | ICD-10-CM

## 2016-09-26 MED ORDER — METHYLPREDNISOLONE ACETATE 80 MG/ML IJ SUSP
80.0000 mg | Freq: Once | INTRAMUSCULAR | Status: AC
  Administered 2016-09-26: 80 mg

## 2016-09-26 MED ORDER — LIDOCAINE HCL (PF) 1 % IJ SOLN
0.3300 mL | Freq: Once | INTRAMUSCULAR | Status: AC
  Administered 2016-09-26: 0.3 mL

## 2016-09-26 NOTE — Patient Instructions (Signed)

## 2016-09-26 NOTE — Procedures (Signed)
Lumbosacral Transforaminal Epidural Steroid Injection - Infraneural Approach with Fluoroscopic Guidance  Patient: Dana Cook      Date of Birth: May 29, 1961 MRN: [id number] PCP: Eulas Post, MD      Visit Date: 09/26/2016   Universal Protocol:    Date/Time: 04/18/181:25 PM  Consent Given By: the patient  Position: PRONE   Additional Comments: Vital signs were monitored before and after the procedure. Patient was prepped and draped in the usual sterile fashion. The correct patient, procedure, and site was verified.   Injection Procedure Details:  Procedure Site One Meds Administered:  Meds ordered this encounter  Medications  . lidocaine (PF) (XYLOCAINE) 1 % injection 0.3 mL  . methylPREDNISolone acetate (DEPO-MEDROL) injection 80 mg      Laterality: Right  Location/Site:  L5-S1  Needle size: 22 G  Needle type: Spinal  Needle Placement: Transforaminal  Findings:  -Contrast Used: 1 mL iohexol 180 mg iodine/mL   -Comments: Excellent flow of contrast along the nerve and into the epidural space.  Procedure Details: After squaring off the end-plates of the desired vertebral level to get a true AP view, the C-arm was obliqued to the painful side so that the superior articulating process is positioned about 1/3 the length of the inferior endplate.  The needle was aimed toward the junction of the superior articular process and the transverse process of the inferior vertebrae. The needle's initial entry is in the lower third of the foramen through Kambin's triangle. The soft tissues overlying this target were infiltrated with 2-3 ml. of 1% Lidocaine without Epinephrine.  The spinal needle was then inserted and advanced toward the target using a "trajectory" view along the fluoroscope beam.  Under AP and lateral visualization, the needle was advanced so it did not puncture dura and did not traverse medially beyond the 6 o'clock position of the pedicle. Bi-planar  projections were used to confirm position. Aspiration was confirmed to be negative for CSF and/or blood. A 1-2 ml. volume of Isovue-250 was injected and flow of contrast was noted at each level. Radiographs were obtained for documentation purposes.   After attaining the desired flow of contrast documented above, a 0.5 to 1.0 ml test dose of 0.25% Marcaine was injected into each respective transforaminal space.  The patient was observed for 90 seconds post injection.  After no sensory deficits were reported, and normal lower extremity motor function was noted,   the above injectate was administered so that equal amounts of the injectate were placed at each foramen (level) into the transforaminal epidural space.   Additional Comments:  The patient tolerated the procedure well Dressing: Band-Aid    Post-procedure details: Patient was observed during the procedure. Post-procedure instructions were reviewed.  Patient left the clinic in stable condition.

## 2016-09-26 NOTE — Progress Notes (Signed)
Dana Cook - 56 y.o. female MRN [id number]  Date of birth: 1960/11/11  Office Visit Note: Visit Date: 09/26/2016 PCP: Eulas Post, MD Referred by: Eulas Post, MD  Subjective: Chief Complaint  Patient presents with  . Lower Back - Pain   HPI: [name] is a 56 year old female with chronic right ncreased, severe right side low back into hip for around 1 month. She's had a many year history of low back and right more than left hip pain. She has a marked convex right scoliosis with facet arthropathy at L5-S1 with foraminal narrowing and lateral recess narrowing. Stinging and burning pain down right leg and recently has started on left thigh as well. States legs have started "jumping" again. She does not carry a diagnosis of restless leg feels like when her symptoms are active and she wants to move her legs frequently. She also reports an area around the paraspinal musculature that she says feels or swollen brief examination of this shows tight paraspinal musculature in the area of the scoliosis with positive trigger points.    ROS Otherwise per HPI.  Assessment & Plan: Visit Diagnoses:  1. Lumbar radiculopathy     Plan: Findings:  Right L5 transforaminal epidural steroid injection. Depending on the relief would look at facet joint block versus S1 transforaminal injection. The area of tight paraspinal musculature is likely muscle spasm and vascular and ice and do stretching.    Meds & Orders:  Meds ordered this encounter  Medications  . lidocaine (PF) (XYLOCAINE) 1 % injection 0.3 mL  . methylPREDNISolone acetate (DEPO-MEDROL) injection 80 mg    Orders Placed This Encounter  Procedures  . XR C-ARM NO REPORT  . Epidural Steroid injection    Follow-up: Return if symptoms worsen or fail to improve.   Procedures: No procedures performed  Lumbosacral Transforaminal Epidural Steroid Injection - Infraneural Approach with Fluoroscopic Guidance  Patient: Dana Cook      Date of Birth: March 08, 1961 MRN: [id number] PCP: Eulas Post, MD      Visit Date: 09/26/2016   Universal Protocol:    Date/Time: 04/18/181:25 PM  Consent Given By: the patient  Position: PRONE   Additional Comments: Vital signs were monitored before and after the procedure. Patient was prepped and draped in the usual sterile fashion. The correct patient, procedure, and site was verified.   Injection Procedure Details:  Procedure Site One Meds Administered:  Meds ordered this encounter  Medications  . lidocaine (PF) (XYLOCAINE) 1 % injection 0.3 mL  . methylPREDNISolone acetate (DEPO-MEDROL) injection 80 mg      Laterality: Right  Location/Site:  L5-S1  Needle size: 22 G  Needle type: Spinal  Needle Placement: Transforaminal  Findings:  -Contrast Used: 1 mL iohexol 180 mg iodine/mL   -Comments: Excellent flow of contrast along the nerve and into the epidural space.  Procedure Details: After squaring off the end-plates of the desired vertebral level to get a true AP view, the C-arm was obliqued to the painful side so that the superior articulating process is positioned about 1/3 the length of the inferior endplate.  The needle was aimed toward the junction of the superior articular process and the transverse process of the inferior vertebrae. The needle's initial entry is in the lower third of the foramen through Kambin's triangle. The soft tissues overlying this target were infiltrated with 2-3 ml. of 1% Lidocaine without Epinephrine.  The spinal needle was then inserted and advanced toward the target using a "  trajectory" view along the fluoroscope beam.  Under AP and lateral visualization, the needle was advanced so it did not puncture dura and did not traverse medially beyond the 6 o'clock position of the pedicle. Bi-planar projections were used to confirm position. Aspiration was confirmed to be negative for CSF and/or blood. A 1-2 ml. volume of  Isovue-250 was injected and flow of contrast was noted at each level. Radiographs were obtained for documentation purposes.   After attaining the desired flow of contrast documented above, a 0.5 to 1.0 ml test dose of 0.25% Marcaine was injected into each respective transforaminal space.  The patient was observed for 90 seconds post injection.  After no sensory deficits were reported, and normal lower extremity motor function was noted,   the above injectate was administered so that equal amounts of the injectate were placed at each foramen (level) into the transforaminal epidural space.   Additional Comments:  The patient tolerated the procedure well Dressing: Band-Aid    Post-procedure details: Patient was observed during the procedure. Post-procedure instructions were reviewed.  Patient left the clinic in stable condition.   Clinical History: Lumbar Spine MRI 2014 L3-4:  Mild disc bulge and mild to moderate facet degenerative change without central canal or foraminal stenosis.  L4-5:  Minimal disc bulge and mild to moderate facet degenerative change without central canal or foraminal narrowing.  L5-S1:  There is right worse than left facet degenerative disease. Shallow disc bulge is present.  There is marked right foraminal narrowing with encroachment on the exiting right L5 root.  The central canal and left foramen are open.  IMPRESSION:  1.  Mild disc bulge and right worse than left facet degenerative change at L5-S1 result in encroachment on the exiting right L5 root.  Central canal and foramina appear open. 2.  Negative for central canal stenosis with minimal disc bulging noted at L3-4 and L4-5. 3.  Marked convex right scoliosis. 4.  1.3 cm gallstone is identified.  She reports that she has never smoked. She has never used smokeless tobacco. No results for input(s): HGBA1C, LABURIC in the last 8760 hours.  Objective:  VS:  HT:    WT:   BMI:     BP:113/77   HR:72bpm  TEMP: ( )  RESP:100 % Physical Exam  Musculoskeletal:  The patient ambulates without aid in the right scoliosis of the lumbar spine with forward flexion. She does have pain with extension rotation to the right. No pain over the greater trochanter. She has good distal strength.    Ortho Exam Imaging: Xr C-arm No Report  Result Date: 09/26/2016 Please see Notes or Procedures tab for imaging impression.   Past Medical/Family/Surgical/Social History: Medications & Allergies reviewed per EMR Patient Active Problem List   Diagnosis Date Noted  . RLS (restless legs syndrome) 12/05/2015  . Contraception management 10/07/2013  . Insomnia 10/07/2013  . Atypical glandular cells of undetermined significance (AGUS) on cervical Pap smear    Past Medical History:  Diagnosis Date  . Abnormal Pap smear of cervix 2008  . Allergy   . Atypical glandular cells of undetermined significance (AGUS) on cervical Pap smear 2008   TWICE, D&C negative  . Chicken pox   . Chronic sinusitis   . Osteoarthritis    Family History  Problem Relation Age of Onset  . Hypertension Mother   . Heart disease Father   . Hypertension Father   . Hypertension Paternal Grandmother   . Colon cancer Neg Hx  Past Surgical History:  Procedure Laterality Date  . DILATION AND CURETTAGE OF UTERUS  2008  . FINGER FRACTURE SURGERY Right 1983   5th finger   Social History   Occupational History  . Not on file.   Social History Main Topics  . Smoking status: Never Smoker  . Smokeless tobacco: Never Used  . Alcohol use No  . Drug use: No  . Sexual activity: Not Currently    Partners: Male

## 2016-10-01 ENCOUNTER — Encounter: Payer: Self-pay | Admitting: Nurse Practitioner

## 2016-10-01 ENCOUNTER — Ambulatory Visit (INDEPENDENT_AMBULATORY_CARE_PROVIDER_SITE_OTHER): Payer: BC Managed Care – PPO | Admitting: Nurse Practitioner

## 2016-10-01 VITALS — BP 116/72 | HR 68 | Ht 63.5 in | Wt 137.0 lb

## 2016-10-01 DIAGNOSIS — Z01411 Encounter for gynecological examination (general) (routine) with abnormal findings: Secondary | ICD-10-CM | POA: Diagnosis not present

## 2016-10-01 DIAGNOSIS — Z0000 Encounter for general adult medical examination without abnormal findings: Secondary | ICD-10-CM | POA: Diagnosis not present

## 2016-10-01 DIAGNOSIS — R350 Frequency of micturition: Secondary | ICD-10-CM

## 2016-10-01 LAB — POCT URINALYSIS DIPSTICK
Bilirubin, UA: NEGATIVE
Blood, UA: NEGATIVE
Glucose, UA: NEGATIVE
Ketones, UA: NEGATIVE
Nitrite, UA: NEGATIVE
Protein, UA: NEGATIVE
Urobilinogen, UA: 0.2 E.U./dL
pH, UA: 6 (ref 5.0–8.0)

## 2016-10-01 NOTE — Progress Notes (Signed)
Encounter reviewed by Dr. Yarieliz Wasser Amundson C. Silva.  

## 2016-10-01 NOTE — Progress Notes (Signed)
56 y.o. G0P0000 Single  Caucasian Fe here for annual exam.  Started weight watchers 2013 but this past 4 months with eating 'correctly' has been loosing weight. Weight last year at 147 lb. and now at 137 lb.  She does sort of go up and down a few pounds anyway.  She feels well without other complaints, weakness, or fatigue.  She is concerned about DM as she has [facility] of this and she has noted urinary frequency.  This could also be related to an increase in po fluids.  We did review her labs and glucose was 94 on 09/03/16.  Had steroid injection last week for OA of lower back. Same partner for 7 yrs.    Patient's last menstrual period was 12/10/2011 (approximate).          Sexually active: No.  The current method of family planning is post menopausal status.    Exercising: Yes.    Home exercise routine includes walking and yard work.. Smoker:  no  Health Maintenance: Pap: 09-27-15 Neg:Neg HR HPV  09-09-14 Neg:Neg HR HPV History of Abnormal Pap: yes,  LSIL; Colpo: 10/28/13, Cervicitis. History of AGUS in 2010 with D&C MMG: 12-29-15 Density B/Neg/BiRads1:[place]  Self Breast exams: sometimes Colonoscopy: 10/15/13, normal, repeat in 10 years BMD: 01/05/16 T Score: -0.2 Spine / -1.5 Right Femur Neck / -1.3 Left Femur Neck TDaP: 11-09-09 Hep C and HIV: Negative 09-27-15 Labs: 09/03/16 in EPIC, to review at today's visit Urine: 1+ leuk, ph: 6.0   reports that she has never smoked. She has never used smokeless tobacco. She reports that she does not drink alcohol or use drugs.  Past Medical History:  Diagnosis Date  . Abnormal Pap smear of cervix 2008  . Allergy   . Atypical glandular cells of undetermined significance (AGUS) on cervical Pap smear 2008   TWICE, D&C negative  . Chicken pox   . Chronic sinusitis   . Osteoarthritis     Past Surgical History:  Procedure Laterality Date  . COLPOSCOPY  2015   LSIL  . DILATION AND CURETTAGE OF UTERUS  2008  . FINGER FRACTURE SURGERY Right 1983   5th  finger    Current Outpatient Prescriptions  Medication Sig Dispense Refill  . amitriptyline (ELAVIL) 10 MG tablet Take 1 tablet (10 mg total) by mouth at bedtime as needed for sleep. 30 tablet 12  . diclofenac sodium (VOLTAREN) 1 % GEL Apply topically 4 (four) times daily.    . naproxen sodium (ANAPROX) 220 MG tablet Take 220 mg by mouth daily as needed.     No current facility-administered medications for this visit.     Family History  Problem Relation Age of Onset  . Hypertension Mother   . Heart disease Father   . Hypertension Father   . Hypertension Paternal Grandmother   . Colon cancer Neg Hx     ROS:  Pertinent items are noted in HPI.  Otherwise, a comprehensive ROS was negative.  Exam:   BP 116/72 (BP Location: Right Arm, Patient Position: Sitting, Cuff Size: Normal)   Pulse 68   Ht 5' 3.5" (1.613 m)   Wt 137 lb (62.1 kg)   LMP 12/10/2011 (Approximate)   BMI 23.89 kg/m  Height: 5' 3.5" (161.3 cm) Ht Readings from Last 3 Encounters:  10/01/16 5' 3.5" (1.613 m)  12/05/15 5\' 4"  (1.626 m)  10/11/15 5\' 4"  (1.626 m)    General appearance: alert, cooperative and appears stated age Head: Normocephalic, without obvious abnormality,  atraumatic Neck: no adenopathy, supple, symmetrical, trachea midline and thyroid normal to inspection and palpation Lungs: clear to auscultation bilaterally Breasts: normal appearance, no masses or tenderness Heart: regular rate and rhythm Abdomen: soft, non-tender; no masses,  no organomegaly Extremities: extremities normal, atraumatic, no cyanosis or edema Skin: Skin color, texture, turgor normal. No rashes or lesions Lymph nodes: Cervical, supraclavicular, and axillary nodes normal. No abnormal inguinal nodes palpated Neurologic: Grossly normal   Pelvic: External genitalia:  no lesions              Urethra:  normal appearing urethra with no masses, tenderness or lesions              Bartholin's and Skene's: normal                  Vagina: normal appearing vagina with normal color and discharge, no lesions              Cervix: anteverted              Pap taken: Yes.   Bimanual Exam:  Uterus:  normal size, contour, position, consistency, mobility, non-tender              Adnexa: no mass, fullness, tenderness               Rectovaginal: Confirms               Anus:  normal sphincter tone, no lesions  Chaperone present: yes  A:  Well Woman with normal exam   Post menopausal History of AGUS pap 2008 & 2010 with D&C History of LGSIL with chronic cervicitis, CIN I 10/26/13 History of OA and only mild osteopenia of right hip History of insomnia History of situational anxiety - better since retired and now off [employer]  Weight loss - some may be due to being more active and remaining on her diet. (also off POP)  [place]:  DM   P:   Reviewed health and wellness pertinent to exam  Pap smear: yes - per request wants pap yearly  Mammogram is due 12/2016  Will get HGB AIC and follow along with urine C&S  Counseled on breast self exam, mammography screening, adequate intake of calcium and vitamin D, diet and exercise, Kegel's exercises return annually or prn  An After Visit Summary was printed and given to the patient.

## 2016-10-01 NOTE — Patient Instructions (Signed)

## 2016-10-02 LAB — HEMOGLOBIN A1C
Hgb A1c MFr Bld: 4.6 % (ref <5.7)
Mean Plasma Glucose: 85 mg/dL

## 2016-10-03 LAB — IPS PAP TEST WITH HPV

## 2016-10-03 LAB — URINE CULTURE: Organism ID, Bacteria: NO GROWTH

## 2016-10-15 ENCOUNTER — Telehealth (INDEPENDENT_AMBULATORY_CARE_PROVIDER_SITE_OTHER): Payer: Self-pay | Admitting: Physical Medicine and Rehabilitation

## 2016-10-15 DIAGNOSIS — M47816 Spondylosis without myelopathy or radiculopathy, lumbar region: Secondary | ICD-10-CM

## 2016-10-15 DIAGNOSIS — M5441 Lumbago with sciatica, right side: Principal | ICD-10-CM

## 2016-10-15 DIAGNOSIS — G8929 Other chronic pain: Secondary | ICD-10-CM

## 2016-10-16 NOTE — Telephone Encounter (Signed)
See my referrals and see that they get sent appropriately, the PT place I put the address and phone number in info section

## 2016-10-16 NOTE — Telephone Encounter (Signed)
Pt called in requesting PT near her for dry needling and also referral to Neurosurgery for consultation. Orders placed

## 2016-10-17 NOTE — Telephone Encounter (Signed)
Referrals faxed and patient notified. Awaiting appointments.

## 2016-10-24 ENCOUNTER — Encounter (INDEPENDENT_AMBULATORY_CARE_PROVIDER_SITE_OTHER): Payer: Self-pay | Admitting: Orthopaedic Surgery

## 2016-10-24 ENCOUNTER — Ambulatory Visit (INDEPENDENT_AMBULATORY_CARE_PROVIDER_SITE_OTHER): Payer: BC Managed Care – PPO | Admitting: Orthopaedic Surgery

## 2016-10-24 ENCOUNTER — Other Ambulatory Visit (INDEPENDENT_AMBULATORY_CARE_PROVIDER_SITE_OTHER): Payer: Self-pay | Admitting: Orthopaedic Surgery

## 2016-10-24 VITALS — BP 136/95 | HR 81 | Resp 14 | Ht 66.0 in | Wt 147.0 lb

## 2016-10-24 DIAGNOSIS — M5417 Radiculopathy, lumbosacral region: Secondary | ICD-10-CM

## 2016-10-24 DIAGNOSIS — M79604 Pain in right leg: Secondary | ICD-10-CM

## 2016-10-24 DIAGNOSIS — M545 Low back pain: Secondary | ICD-10-CM

## 2016-10-24 NOTE — Progress Notes (Signed)
Office Visit Note   Patient: Dana Cook           Date of Birth: [birth year]           MRN: [id number] Visit Date: 10/24/2016              Requested by: Eulas Post, MD [place], [place] [address] PCP: Eulas Post, MD   Assessment & Plan: Visit Diagnoses:  1. Low back pain radiating to right leg   Chronic low back pain being followed by Dr. Ernestina Patches. She's had a number of injections over the several few years. Initially the pain would resolve 4-5 months, but recently only having 12 months relief. Prior MRI scan demonstrates facet arthritis at L5-S1 associated with a marked convex right scoliosis  Plan: Repeat MRI scan to compare to that which was performed in 2014. Return to office after scan  Follow-Up Instructions: Return after MRI L-S spine.   Orders:  No orders of the defined types were placed in this encounter.  No orders of the defined types were placed in this encounter.     Procedures: No procedures performed   Clinical Data: No additional findings.   Subjective: Chief Complaint  Patient presents with  . Lower Back - Pain  Dana Cook has been complaining of low back pain for "many years". In 2014 she had an MRI scan of her lumbar spine that was negative for central stenosis but with significant facet degenerative changes at L5-S1 resulting in encroachment on the exiting right L5 root. She has seen Dr. Ernestina Patches on a number of occasions for injections. Initially she had relief for 4-5 months, but recently only experiencing 1-2 months relief. She is just frustrated with her recurrent pain that seems to interfere with her a lot of her activities. She's having trouble when she stands for a length of time or even sitting and at church. She has just discomfort when she is outside working. Only on occasion is she having numbness and tingling in her right leg. She denies any bowel or bladder dysfunction dysfunction  HPI  Review of  Systems   Objective: Vital Signs: BP (!) 136/95   Pulse 81   Resp 14   Ht 5\' 6"  (1.676 m)   Wt 147 lb (66.7 kg)   LMP 12/10/2011 (Approximate)   BMI 23.73 kg/m   Physical Exam  Ortho Exam straight leg raise negative bilaterally. Reflexes are symmetrical. Motor and sensory exam intact. Painless range of motion both hips. Mild percussible tenderness of the lower lumbar spine and to the right of the parasacral area .no pain with range of motion of either hip.    Imaging: No results found.   PMFS History: Patient Active Problem List   Diagnosis Date Noted  . RLS (restless legs syndrome) 12/05/2015  . Contraception management 10/07/2013  . Insomnia 10/07/2013  . Atypical glandular cells of undetermined significance (AGUS) on cervical Pap smear    Past Medical History:  Diagnosis Date  . Abnormal Pap smear of cervix 2008  . Allergy   . Atypical glandular cells of undetermined significance (AGUS) on cervical Pap smear 2008   TWICE, D&C negative  . Chicken pox   . Chronic sinusitis   . Osteoarthritis     Family History  Problem Relation Age of Onset  . Hypertension Mother   . Heart disease Father   . Hypertension Father   . Hypertension Paternal Grandmother   . Colon cancer Neg Hx  Past Surgical History:  Procedure Laterality Date  . COLPOSCOPY  2015   LSIL  . DILATION AND CURETTAGE OF UTERUS  2008  . FINGER FRACTURE SURGERY Right 1983   5th finger   Social History   Occupational History  . Not on file.   Social History Main Topics  . Smoking status: Never Smoker  . Smokeless tobacco: Never Used  . Alcohol use No  . Drug use: No  . Sexual activity: Not Currently    Partners: Male     Garald Balding, MD   Note - This record has been created using [employer].  Chart creation errors have been sought, but may not always  have been located. Such creation errors do not reflect on  the standard of medical care.

## 2016-10-30 ENCOUNTER — Ambulatory Visit (HOSPITAL_COMMUNITY)
Admission: RE | Admit: 2016-10-30 | Discharge: 2016-10-30 | Disposition: A | Discharge status: Home / Self Care | Payer: BC Managed Care – PPO | Source: Ambulatory Visit | Attending: Orthopaedic Surgery | Admitting: Orthopaedic Surgery

## 2016-10-30 ENCOUNTER — Other Ambulatory Visit: Payer: Self-pay | Admitting: Nurse Practitioner

## 2016-10-30 DIAGNOSIS — M419 Scoliosis, unspecified: Secondary | ICD-10-CM | POA: Insufficient documentation

## 2016-10-30 DIAGNOSIS — M5417 Radiculopathy, lumbosacral region: Secondary | ICD-10-CM

## 2016-10-30 DIAGNOSIS — Z1231 Encounter for screening mammogram for malignant neoplasm of breast: Secondary | ICD-10-CM

## 2016-10-30 DIAGNOSIS — M5136 Other intervertebral disc degeneration, lumbar region: Secondary | ICD-10-CM | POA: Diagnosis not present

## 2016-10-30 DIAGNOSIS — M545 Low back pain: Secondary | ICD-10-CM | POA: Insufficient documentation

## 2016-10-31 ENCOUNTER — Ambulatory Visit (INDEPENDENT_AMBULATORY_CARE_PROVIDER_SITE_OTHER): Payer: BC Managed Care – PPO | Admitting: Orthopaedic Surgery

## 2016-10-31 ENCOUNTER — Encounter (INDEPENDENT_AMBULATORY_CARE_PROVIDER_SITE_OTHER): Payer: Self-pay | Admitting: Orthopaedic Surgery

## 2016-10-31 VITALS — BP 117/75 | HR 72 | Ht 64.0 in | Wt 135.0 lb

## 2016-10-31 DIAGNOSIS — M545 Low back pain, unspecified: Secondary | ICD-10-CM | POA: Insufficient documentation

## 2016-10-31 DIAGNOSIS — G8929 Other chronic pain: Secondary | ICD-10-CM | POA: Diagnosis not present

## 2016-10-31 NOTE — Progress Notes (Signed)
Office Visit Note   Patient: Dana Cook           Date of Birth: 04/16/1961           MRN: [id number] Visit Date: 10/31/2016              Requested by: Eulas Post, MD [place], [place] [id number] PCP: Eulas Post, MD   Assessment & Plan: Visit Diagnoses:  1. 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 Office 6 weeks  Follow-Up Instructions: Return in about 6 weeks (around 12/12/2016).   Orders:  No orders of the defined types were placed in this encounter.  No orders of the defined types were placed in this encounter.     Procedures: No procedures performed   Clinical Data: No additional findings.   Subjective: Chief Complaint  Patient presents with  . Lower Back - Pain    Patient c/o right hip pain wants to know if this is from her hip or her back   . Results    MRI Review lumbar   No change in symptoms of chronic low back pain. Predominant pain in the lower lumbar spine with some referred pain to the right buttock. No pain in either lower extremity. No history of numbness or tingling. See MRI scan report below.  HPI  Review of Systems   Objective: Vital Signs: BP 117/75   Pulse 72   Ht 5\' 4"  (1.626 m)   Wt 135 lb (61.2 kg)   LMP 12/10/2011 (Approximate)   BMI 23.17 kg/m   Physical Exam  Ortho Exam straight leg raise minimally positive and 90 on the right for low-back pain negative on the left. Neurovascular exam intact. Painless range of motion of both hips and both knees. No lower extremity edema. Some percussible tenderness of lower lumbar spine. Her right buttock but no mass formation. No pain over the ischial tuberosity or the sacroiliac joints.  Specialty Comments:  No specialty  comments available.  Imaging: Mr Lumbar Spine Wo Contrast  Result Date: 10/31/2016 CLINICAL DATA:  Low back pain radiating into the right hip for the past 7 years. No known injury. EXAM: MRI LUMBAR SPINE WITHOUT CONTRAST TECHNIQUE: Multiplanar, multisequence MR imaging of the lumbar spine was performed. No intravenous contrast was administered. COMPARISON:  MRI lumbar spine 10/21/2012. FINDINGS: Segmentation:  Standard. Alignment: There is marked convex right scoliosis with the apex at L2-3. No listhesis is identified. Vertebrae: No fracture or worrisome lesion. Degenerative endplate signal change is most notable at L5-S1. Conus medullaris: Extends to the L1-2 level and appears normal. Paraspinal and other soft tissues: Small T2 hyperintense lesion right kidney is most consistent with a cyst. Disc levels: T10-11, T11-12 and T12-L1 are imaged in the sagittal plane only. Small disc protrusions are seen at each level, most prominent at T10-11. No central canal or foraminal narrowing is identified. L1-2:  Negative. L2-3:  Negative. L3-4: Shallow disc bulge and mild-to-moderate facet degenerative disease without central canal or foraminal narrowing. L4-5: Shallow disc bulge is slightly increased in size since the prior examination but the central canal and foramina appear open. Mild to moderate facet degenerative disease shows some worsening. L5-S1: Shallow disc bulge and facet degenerative disease appear unchanged. Severe right foraminal narrowing persists. The central canal  and left foramen are open. IMPRESSION: Some progression of degenerative disease at L4-5 where facet arthropathy has progressed and there is a shallow disc bulge without central canal or foraminal stenosis. No change in marked right foraminal narrowing L5-S1. Convex right scoliosis. Electronically Signed   By: Inge Rise M.D.   On: 10/31/2016 08:24     PMFS History: Patient Active Problem List   Diagnosis Date Noted  . Chronic  midline low back pain without sciatica 10/31/2016  . RLS (restless legs syndrome) 12/05/2015  . Contraception management 10/07/2013  . Insomnia 10/07/2013  . Atypical glandular cells of undetermined significance (AGUS) on cervical Pap smear    Past Medical History:  Diagnosis Date  . Abnormal Pap smear of cervix 2008  . Allergy   . Atypical glandular cells of undetermined significance (AGUS) on cervical Pap smear 2008   TWICE, D&C negative  . Chicken pox   . Chronic sinusitis   . Osteoarthritis     Family History  Problem Relation Age of Onset  . Hypertension Mother   . Heart disease Father   . Hypertension Father   . Hypertension Paternal Grandmother   . Colon cancer Neg Hx     Past Surgical History:  Procedure Laterality Date  . COLPOSCOPY  2015   LSIL  . DILATION AND CURETTAGE OF UTERUS  2008  . FINGER FRACTURE SURGERY Right 1983   5th finger   Social History   Occupational History  . Not on file.   Social History Main Topics  . Smoking status: Never Smoker  . Smokeless tobacco: Never Used  . Alcohol use No  . Drug use: No  . Sexual activity: Not Currently    Partners: Male     Garald Balding, MD   Note - This record has been created using [employer].  Chart creation errors have been sought, but may not always  have been located. Such creation errors do not reflect on  the standard of medical care.

## 2017-01-02 ENCOUNTER — Ambulatory Visit (HOSPITAL_COMMUNITY)
Admission: RE | Admit: 2017-01-02 | Discharge: 2017-01-02 | Disposition: A | Discharge status: Home / Self Care | Payer: BC Managed Care – PPO | Source: Ambulatory Visit | Attending: Nurse Practitioner | Admitting: Nurse Practitioner

## 2017-01-02 ENCOUNTER — Ambulatory Visit (HOSPITAL_COMMUNITY): Payer: BC Managed Care – PPO

## 2017-01-02 DIAGNOSIS — Z1231 Encounter for screening mammogram for malignant neoplasm of breast: Secondary | ICD-10-CM | POA: Insufficient documentation

## 2017-01-04 ENCOUNTER — Ambulatory Visit (HOSPITAL_COMMUNITY): Payer: BC Managed Care – PPO

## 2017-01-14 ENCOUNTER — Encounter: Payer: Self-pay | Admitting: Family Medicine

## 2017-02-06 ENCOUNTER — Ambulatory Visit (INDEPENDENT_AMBULATORY_CARE_PROVIDER_SITE_OTHER): Payer: BC Managed Care – PPO | Admitting: Family Medicine

## 2017-02-06 ENCOUNTER — Encounter: Payer: Self-pay | Admitting: Family Medicine

## 2017-02-06 VITALS — BP 98/70 | HR 82 | Temp 98.2°F | Wt 136.3 lb

## 2017-02-06 DIAGNOSIS — G2581 Restless legs syndrome: Secondary | ICD-10-CM

## 2017-02-06 MED ORDER — ROPINIROLE HCL 0.25 MG PO TABS: ORAL_TABLET | ORAL | 6 refills | Status: DC

## 2017-02-06 NOTE — Progress Notes (Signed)
Subjective:     Patient ID: Dana Cook, female   DOB: [birth year], 56 y.o.   MRN: [id number]  HPI Patient her to discuss possible restless leg symptoms. She did briefly mention these before but symptoms have progressed this year. She has frequent sensation at night of "crawling" sensation or jumping sensation of her legs. This is relieved with movement. Her mother has restless legs and states that she describes exactly the same symptoms. Patient does not consume any caffeine. She had recent labs through GYN with hemoglobin 12.8. No clear exacerbating factors. Usually has symptoms at least 4 nights for week to the severity that it greatly interferes with sleep.  Past Medical History:  Diagnosis Date  . Abnormal Pap smear of cervix 2008  . Allergy   . Atypical glandular cells of undetermined significance (AGUS) on cervical Pap smear 2008   TWICE, D&C negative  . Chicken pox   . Chronic sinusitis   . Osteoarthritis    Past Surgical History:  Procedure Laterality Date  . COLPOSCOPY  2015   LSIL  . DILATION AND CURETTAGE OF UTERUS  2008  . FINGER FRACTURE SURGERY Right 1983   5th finger    reports that she has never smoked. She has never used smokeless tobacco. She reports that she does not drink alcohol or use drugs. family history includes Heart disease in her father; Hypertension in her father, mother, and paternal grandmother. Allergies  Allergen Reactions  . Erythromycin Nausea And Vomiting    Stomach cramps     Review of Systems  Constitutional: Negative for fatigue.  Eyes: Negative for visual disturbance.  Respiratory: Negative for cough, chest tightness, shortness of breath and wheezing.   Cardiovascular: Negative for chest pain, palpitations and leg swelling.  Neurological: Negative for dizziness, seizures, syncope, weakness, light-headedness and headaches.  Psychiatric/Behavioral: Positive for sleep disturbance.       Objective:   Physical Exam  Constitutional:  She is oriented to person, place, and time. She appears well-developed and well-nourished.  Cardiovascular: Normal rate and regular rhythm.   Pulmonary/Chest: Effort normal and breath sounds normal. No respiratory distress. She has no wheezes. She has no rales.  Musculoskeletal: She exhibits no edema.  Neurological: She is alert and oriented to person, place, and time. No cranial nerve deficit.       Assessment:     Restless leg syndrome    Plan:     -continue to avoid caffeine at night -Discussed medication options for treating. We elected to start Requip 0.25 mg 1 at night may increase to 1 tablet every 3 nights until dose of 1 mg. Touch base if not controlled at 1 mg.  Eulas Post MD [facility]

## 2017-02-06 NOTE — Patient Instructions (Signed)
Restless Legs Syndrome Restless legs syndrome is a condition that causes uncomfortable feelings or sensations in the legs, especially while sitting or lying down. The sensations usually cause an overwhelming urge to move the legs. The arms can also sometimes be affected. The condition can range from mild to severe. The symptoms often interfere with a person's ability to sleep. What are the causes? The cause of this condition is not known. What increases the risk? This condition is more likely to develop in:  People who are older than age 17.  Pregnant women. In general, restless legs syndrome is more common in women than in men.  People who have a family history of the condition.  People who have certain medical conditions, such as iron deficiency, kidney disease, Parkinson disease, or nerve damage.  People who take certain medicines, such as medicines for high blood pressure, nausea, colds, allergies, depression, and some heart conditions.  What are the signs or symptoms? The main symptom of this condition is uncomfortable sensations in the legs. These sensations may be:  Described as pulling, tingling, prickling, throbbing, crawling, or burning.  Worse while you are sitting or lying down.  Worse during periods of rest or inactivity.  Worse at night, often interfering with your sleep.  Accompanied by a very strong urge to move your legs.  Temporarily relieved by movement of your legs.  The sensations usually affect both sides of the body. The arms can also be affected, but this is rare. People who have this condition often have tiredness during the day because of their lack of sleep at night. How is this diagnosed? This condition may be diagnosed based on your description of the symptoms. You may also have tests, including blood tests, to check for other conditions that may lead to your symptoms. In some cases, you may be asked to spend some time in a sleep lab so your sleeping  can be monitored. How is this treated? Treatment for this condition is focused on managing the symptoms. Treatment may include:  Self-help and lifestyle changes.  Medicines.  Follow these instructions at home:  Take medicines only as directed by your health care provider.  Try these methods to get temporary relief from the uncomfortable sensations: ? Massage your legs. ? Walk or stretch. ? Take a cold or hot bath.  Practice good sleep habits. For example, go to bed and get up at the same time every day.  Exercise regularly.  Practice ways of relaxing, such as yoga or meditation.  Avoid caffeine and alcohol.  Do not use any tobacco products, including cigarettes, chewing tobacco, or electronic cigarettes. If you need help quitting, ask your health care provider.  Keep all follow-up visits as directed by your health care provider. This is important. Contact a health care provider if: Your symptoms do not improve with treatment, or they get worse. This information is not intended to replace advice given to you by your health care provider. Make sure you discuss any questions you have with your health care provider. Document Released: 05/18/2002 Document Revised: 11/03/2015 Document Reviewed: 05/24/2014 Elsevier Interactive Patient Education  2018 [employer] at one per night and may increase by one tablet every 3 nights up to dose of 4 tabs/night if needed.

## 2017-02-28 ENCOUNTER — Encounter: Payer: Self-pay | Admitting: Family Medicine

## 2017-05-20 ENCOUNTER — Encounter: Payer: Self-pay | Admitting: Family Medicine

## 2017-05-22 ENCOUNTER — Ambulatory Visit (INDEPENDENT_AMBULATORY_CARE_PROVIDER_SITE_OTHER): Payer: BC Managed Care – PPO | Admitting: Orthopaedic Surgery

## 2017-05-22 ENCOUNTER — Encounter (INDEPENDENT_AMBULATORY_CARE_PROVIDER_SITE_OTHER): Payer: Self-pay | Admitting: Orthopaedic Surgery

## 2017-05-22 ENCOUNTER — Other Ambulatory Visit: Payer: Self-pay | Admitting: *Deleted

## 2017-05-22 DIAGNOSIS — G8929 Other chronic pain: Secondary | ICD-10-CM

## 2017-05-22 DIAGNOSIS — M545 Low back pain, unspecified: Secondary | ICD-10-CM

## 2017-05-22 MED ORDER — PRAMIPEXOLE DIHYDROCHLORIDE 0.5 MG PO TABS: 0.5000 mg | ORAL_TABLET | Freq: Every day | ORAL | 1 refills | Status: DC

## 2017-05-22 MED ORDER — ROPINIROLE HCL 0.5 MG PO TABS: 0.5000 mg | ORAL_TABLET | Freq: Three times a day (TID) | ORAL | 1 refills | Status: DC

## 2017-05-22 NOTE — Progress Notes (Signed)
Office Visit Note   Patient: Dana Cook           Date of Birth: [birth year]           MRN: [id number] Visit Date: 05/22/2017              Requested by: Eulas Post, MD [place], [place] [id number] PCP: Eulas Post, MD   Assessment & Plan: Visit Diagnoses:  1. Chronic right-sided low back pain without sciatica   2. Chronic midline low back pain without sciatica     Plan: Long discussion regarding present status of low back. She like to go back and see Dr. Ernestina Patches to consider radiofrequency ablation at L4-5. We'll schedule a follow-up with Dr. Ernestina Patches and plan to see her back as needed. I have also discussed the possibility of spine surgeon evaluation in the future total time spent approximately 40 minutes  Follow-Up Instructions: Return if symptoms worsen or fail to improve.   Orders:  No orders of the defined types were placed in this encounter.  No orders of the defined types were placed in this encounter.     Procedures: No procedures performed   Clinical Data: No additional findings.   Subjective: No chief complaint on file. Dana Cook is having recurrent problems with her lumbar spine. Her past history is complicated and complex as she's had trouble for many years. Her most recent MRI scan was performed in May demonstrating progression of degenerative changes at L4-5 where there was facet arthropathy that had progressed and and shallow disc bulge without central canal or foraminal stenosis. There was no change in the marked right foraminal narrowing at L5-S1. She had a convex right scoliosis. Never had any prior surgery. She has been seen by Dr. Ernestina Patches and with several injections which only gave her temporary relief of her pain. She's also been through a course of physical therapy including dry needling. On each occasion the kneeling would help but only last about 2 weeks. She's had a recent exacerbation without injury or trauma. She still is  having predominantly back pain and with occasional some discomfort towards the right buttock. There is no bowel or bladder dysfunction. She presently is not working.  HPI  Review of Systems   Objective: Vital Signs: LMP 12/10/2011 (Approximate)   Physical Exam  Ortho Exam awake alert and oriented 3. Comfortable sitting. Straight leg raise negative bilaterally. Neurovascular exam intact. No pain with range of motion of her hip. No pain along the lateral aspect of her right hip. No percussible tenderness of lumbar spine or at the level of the sacroiliac joints. Specialty Comments:  No specialty comments available.  Imaging: No results found.   PMFS History: Patient Active Problem List   Diagnosis Date Noted  . Chronic midline low back pain without sciatica 10/31/2016  . RLS (restless legs syndrome) 12/05/2015  . Contraception management 10/07/2013  . Insomnia 10/07/2013  . Atypical glandular cells of undetermined significance (AGUS) on cervical Pap smear    Past Medical History:  Diagnosis Date  . Abnormal Pap smear of cervix 2008  . Allergy   . Atypical glandular cells of undetermined significance (AGUS) on cervical Pap smear 2008   TWICE, D&C negative  . Chicken pox   . Chronic sinusitis   . Osteoarthritis     Family History  Problem Relation Age of Onset  . Hypertension Mother   . Heart disease Father   . Hypertension Father   . Hypertension  Paternal Grandmother   . Colon cancer Neg Hx     Past Surgical History:  Procedure Laterality Date  . COLPOSCOPY  2015   LSIL  . DILATION AND CURETTAGE OF UTERUS  2008  . FINGER FRACTURE SURGERY Right 1983   5th finger   Social History   Occupational History  . Not on file  Tobacco Use  . Smoking status: Never Smoker  . Smokeless tobacco: Never Used  Substance and Sexual Activity  . Alcohol use: No    Alcohol/week: 0.0 oz  . Drug use: No  . Sexual activity: Not Currently    Partners: Male     Garald Balding, MD   Note - This record has been created using [employer].  Chart creation errors have been sought, but may not always  have been located. Such creation errors do not reflect on  the standard of medical care.

## 2017-05-23 ENCOUNTER — Telehealth (INDEPENDENT_AMBULATORY_CARE_PROVIDER_SITE_OTHER): Payer: Self-pay | Admitting: Physical Medicine and Rehabilitation

## 2017-05-23 NOTE — Telephone Encounter (Signed)
Left message for patient to call back to schedule.  °

## 2017-05-23 NOTE — Telephone Encounter (Signed)
Schedule for medial blocks and we can talk about RFA

## 2017-05-24 NOTE — Telephone Encounter (Signed)
Scheduled for 06/10/17 at 1345.

## 2017-06-10 ENCOUNTER — Ambulatory Visit (INDEPENDENT_AMBULATORY_CARE_PROVIDER_SITE_OTHER): Payer: BC Managed Care – PPO | Admitting: Physical Medicine and Rehabilitation

## 2017-06-10 ENCOUNTER — Ambulatory Visit (INDEPENDENT_AMBULATORY_CARE_PROVIDER_SITE_OTHER): Payer: Self-pay

## 2017-06-10 ENCOUNTER — Encounter (INDEPENDENT_AMBULATORY_CARE_PROVIDER_SITE_OTHER): Payer: Self-pay | Admitting: Physical Medicine and Rehabilitation

## 2017-06-10 VITALS — BP 109/75 | HR 88 | Temp 98.1°F

## 2017-06-10 DIAGNOSIS — M5416 Radiculopathy, lumbar region: Secondary | ICD-10-CM

## 2017-06-10 DIAGNOSIS — M5116 Intervertebral disc disorders with radiculopathy, lumbar region: Secondary | ICD-10-CM

## 2017-06-10 MED ORDER — METHYLPREDNISOLONE ACETATE 80 MG/ML IJ SUSP
80.0000 mg | Freq: Once | INTRAMUSCULAR | Status: AC
  Administered 2017-06-10: 80 mg

## 2017-06-10 NOTE — Patient Instructions (Signed)

## 2017-06-10 NOTE — Progress Notes (Signed)
Pt states pain in lower back and right hip that radiates to right leg. Pt states numbness in right thigh. No pain in groin area. +Driver, -Dye Allergies, -BT. Pt states last injection did not help.

## 2017-06-24 ENCOUNTER — Telehealth (INDEPENDENT_AMBULATORY_CARE_PROVIDER_SITE_OTHER): Payer: Self-pay | Admitting: Physical Medicine and Rehabilitation

## 2017-06-24 NOTE — Procedures (Signed)
Dana Cook is a very pleasant 57 year old female that I been seeing for chronic right hip pain.  She does get lost to follow-up at times after the injection and then will find out that it was not very beneficial.  I have had a evaluation and talk with her on numerous occasions and I am just not sure at this point had to get her to follow-up when it does not seem to help.  The last time I saw her was in April and we completed a right L5 transforaminal epidural steroid injection with what appeared to be initial flow of contrast in the facet joint and then ultimately like good flow of contrast around the nerve root.  She said it did help some but she had continued issues and so she did follow-up with Dr. Durward Fortes.  He evaluate her once again and did obtain a new MRI.  The MRI is reviewed below.  Shows continued scoliosis with severe right foraminal narrowing at L5 along with facet arthropathy which is only mild to moderate at L4-5 with central disc protrusion which is noncompressive at this level.  He suggested radiofrequency ablation of the L4-5 facet joint.  Unfortunately, cannot just go forward with ablation but would have to do diagnostic medial branch blocks ahead of time.  Also this does not fit her current symptoms.  She has lower back pain radiating to the right hip and right leg with numbness in the thigh.  I do not think that this is a issue with the facet joint although I guess I cannot completely rule it out.  The last injection seemed not to help very much but there was difficulty obtaining flow of contrast.  I think the best approach here is a right L5 transforaminal injection as a repeat injection one time.  We will take care and getting very good flow of contrast along the nerve root in the epidural space.  If she does not get relief from that we could entertain the idea of the facet joint medial branch block.  Fortunately I think she is going to need to see a spine surgeon given her age of 34  returned as would be medication management.  Lumbosacral Transforaminal Epidural Steroid Injection - Sub-Pedicular Approach with Fluoroscopic Guidance  Patient: Dana Cook      Date of Birth: 23-Dec-1960 MRN: [id number] PCP: Eulas Post, MD      Visit Date: 06/10/2017   Universal Protocol:    Date/Time: 06/10/2017  Consent Given By: the patient  Position: PRONE  Additional Comments: Vital signs were monitored before and after the procedure. Patient was prepped and draped in the usual sterile fashion. The correct patient, procedure, and site was verified.   Injection Procedure Details:  Procedure Site One Meds Administered:  Meds ordered this encounter  Medications  . methylPREDNISolone acetate (DEPO-MEDROL) injection 80 mg    Laterality: Right  Location/Site:  L5-S1  Needle size: 22 G  Needle type: Spinal  Needle Placement: Transforaminal  Findings:    -Comments: Excellent flow of contrast along the nerve and into the epidural space.  Procedure Details: After squaring off the end-plates to get a true AP view, the C-arm was positioned so that an oblique view of the foramen as noted above was visualized. The target area is just inferior to the "nose of the scotty dog" or sub pedicular. The soft tissues overlying this structure were infiltrated with 2-3 ml. of 1% Lidocaine without Epinephrine.  The spinal needle was inserted  toward the target using a "trajectory" view along the fluoroscope beam.  Under AP and lateral visualization, the needle was advanced so it did not puncture dura and was located close the 6 O'Clock position of the pedical in AP tracterory. Biplanar projections were used to confirm position. Aspiration was confirmed to be negative for CSF and/or blood. A 1-2 ml. volume of Isovue-250 was injected and flow of contrast was noted at each level. Radiographs were obtained for documentation purposes.   After attaining the desired flow of contrast  documented above, a 0.5 to 1.0 ml test dose of 0.25% Marcaine was injected into each respective transforaminal space.  The patient was observed for 90 seconds post injection.  After no sensory deficits were reported, and normal lower extremity motor function was noted,   the above injectate was administered so that equal amounts of the injectate were placed at each foramen (level) into the transforaminal epidural space.   Additional Comments:  The patient tolerated the procedure well Dressing: Band-Aid    Post-procedure details: Patient was observed during the procedure. Post-procedure instructions were reviewed.  Patient left the clinic in stable condition.   Pertinent Imaging: MRI LUMBAR SPINE WITHOUT CONTRAST  TECHNIQUE: Multiplanar, multisequence MR imaging of the lumbar spine was performed. No intravenous contrast was administered.  COMPARISON:  MRI lumbar spine 10/21/2012.  FINDINGS: Segmentation:  Standard.  Alignment: There is marked convex right scoliosis with the apex at L2-3. No listhesis is identified.  Vertebrae: No fracture or worrisome lesion. Degenerative endplate signal change is most notable at L5-S1.  Conus medullaris: Extends to the L1-2 level and appears normal.  Paraspinal and other soft tissues: Small T2 hyperintense lesion right kidney is most consistent with a cyst.  Disc levels:  T10-11, T11-12 and T12-L1 are imaged in the sagittal plane only. Small disc protrusions are seen at each level, most prominent at T10-11. No central canal or foraminal narrowing is identified.  L1-2:  Negative.  L2-3:  Negative.  L3-4: Shallow disc bulge and mild-to-moderate facet degenerative disease without central canal or foraminal narrowing.  L4-5: Shallow disc bulge is slightly increased in size since the prior examination but the central canal and foramina appear open. Mild to moderate facet degenerative disease shows some  worsening.  L5-S1: Shallow disc bulge and facet degenerative disease appear unchanged. Severe right foraminal narrowing persists. The central canal and left foramen are open.  IMPRESSION: Some progression of degenerative disease at L4-5 where facet arthropathy has progressed and there is a shallow disc bulge without central canal or foraminal stenosis.  No change in marked right foraminal narrowing L5-S1.  Convex right scoliosis.   Electronically Signed   By: Inge Rise M.D.   On: 10/31/2016 08:24

## 2017-06-24 NOTE — Telephone Encounter (Signed)
Awesome, she will likely need surgical procedure at some point, though

## 2017-07-18 ENCOUNTER — Other Ambulatory Visit: Payer: Self-pay | Admitting: Family Medicine

## 2017-07-19 ENCOUNTER — Other Ambulatory Visit: Payer: Self-pay | Admitting: *Deleted

## 2017-07-19 MED ORDER — ROPINIROLE HCL 0.5 MG PO TABS: 0.5000 mg | ORAL_TABLET | Freq: Three times a day (TID) | ORAL | 0 refills | Status: DC

## 2017-09-30 ENCOUNTER — Ambulatory Visit: Payer: BC Managed Care – PPO | Admitting: Obstetrics & Gynecology

## 2017-10-02 ENCOUNTER — Ambulatory Visit: Payer: BC Managed Care – PPO | Admitting: Nurse Practitioner

## 2017-10-29 NOTE — Progress Notes (Addendum)
57 y.o. G0P0000 SingleCaucasianF here for annual exam.  Doing well.  Does have some low back pain.  Had some injections that didn't really help.  Having a lot of fatigue.    Denies vaginal bleeding.     In long term relationship for 6+ years.  Worked on weight loss last year and is down about 20 pounds total.    PMP:  2015     Sexually active: No.  The current method of family planning is post menopausal status.    Exercising: Yes.    walking, stretches and gardening Smoker:  no  Health Maintenance: Pap: 10-01-16 Neg:Neg HR HPV, 10-07-15 Neg:Neg HR HPV History of abnormal Pap:  yes MMG:  01-02-17 Density B/Neg/BiRads1 Colonoscopy:  10/15/13, normal, repeat in 10 years BMD:  01/05/16 T Score: -0.2 Spine / -1.5 Right Femur Neck / -1.3 Left Femur Neck TDaP: 11-09-09 Pneumonia vaccine(s):  no Shingrix:   no Hep C testing: 10-27-15 Neg Screening Labs: will do this today   reports that she has never smoked. She has never used smokeless tobacco. She reports that she does not drink alcohol or use drugs.  Past Medical History:  Diagnosis Date  . Abnormal Pap smear of cervix 2008  . Allergy   . Atypical glandular cells of undetermined significance (AGUS) on cervical Pap smear 2008   TWICE, D&C negative  . Chicken pox   . Chronic sinusitis   . Osteoarthritis     Past Surgical History:  Procedure Laterality Date  . COLPOSCOPY  2015   LSIL  . DILATION AND CURETTAGE OF UTERUS  2008  . FINGER FRACTURE SURGERY Right 1983   5th finger    Current Outpatient Medications  Medication Sig Dispense Refill  . amitriptyline (ELAVIL) 10 MG tablet Take 1 tablet (10 mg total) by mouth at bedtime as needed for sleep. 30 tablet 12  . naproxen sodium (ANAPROX) 220 MG tablet Take 220 mg by mouth daily as needed.    Marland Kitchen rOPINIRole (REQUIP) 0.5 MG tablet Take 1 tablet (0.5 mg total) by mouth 3 (three) times daily. (Patient taking differently: Take 0.5 mg by mouth 3 (three) times daily as needed. ) 90 tablet 0    No current facility-administered medications for this visit.     Family History  Problem Relation Age of Onset  . Hypertension Mother   . Heart disease Father   . Hypertension Father   . Hypertension Paternal Grandmother   . Colon cancer Neg Hx     Review of Systems  Constitutional: Negative.   HENT: Negative.   Eyes: Negative.   Respiratory: Negative.   Cardiovascular: Negative.   Gastrointestinal: Negative.   Genitourinary: Negative.   Musculoskeletal: Positive for joint pain.       Muscle pain  Skin: Negative.   Neurological: Negative.   Endo/Heme/Allergies: Negative.   Psychiatric/Behavioral: Negative.     Exam:   BP 118/64 (BP Location: Right Arm, Patient Position: Sitting, Cuff Size: Normal)   Pulse 76   Resp 18   Ht 5\' 3"  (1.6 m)   Wt 130 lb (59 kg)   LMP 12/10/2011 (Approximate)   BMI 23.03 kg/m   Height: 5\' 3"  (160 cm)  Ht Readings from Last 3 Encounters:  11/01/17 5\' 3"  (1.6 m)  10/31/16 5\' 4"  (1.626 m)  10/24/16 5\' 6"  (1.676 m)    General appearance: alert, cooperative and appears stated age Head: Normocephalic, without obvious abnormality, atraumatic Neck: no adenopathy, supple, symmetrical, trachea midline and thyroid normal  to inspection and palpation Lungs: clear to auscultation bilaterally Breasts: normal appearance, no masses or tenderness Heart: regular rate and rhythm Abdomen: soft, non-tender; bowel sounds normal; no masses,  no organomegaly Extremities: extremities normal, atraumatic, no cyanosis or edema Skin: Skin color, texture, turgor normal. No rashes or lesions Lymph nodes: Cervical, supraclavicular, and axillary nodes normal. No abnormal inguinal nodes palpated Neurologic: Grossly normal   Pelvic: External genitalia:  no lesions              Urethra:  normal appearing urethra with no masses, tenderness or lesions              Bartholins and Skenes: normal                 Vagina: normal appearing vagina with normal color and  discharge, no lesions              Cervix: no lesions              Pap taken: Yes.   Bimanual Exam:  Uterus:  normal size, contour, position, consistency, mobility, non-tender              Adnexa: normal adnexa and no mass, fullness, tenderness               Rectovaginal: Confirms               Anus:  normal sphincter tone, no lesions  Chaperone was present for exam.  A:  Well Woman with normal exam PMP, no HRT H/o AGUS pap 2008 and 2010, h/o D&C H/O osteoarthritis Insomnia, chronic  P:   Mammogram guidelines reviewed.  pap smear with HR HPV obtained 2017.  Will obtain pap only per pt request.  Guidelines reviewed.   TSH, Lipids, CMP, CBC, Vit D obtained today Elavil 10mg  qhs prn insomnia. Uses rarely.  Does not usually use one prescription in entire year. return annually or prn

## 2017-11-01 ENCOUNTER — Encounter: Payer: Self-pay | Admitting: Obstetrics & Gynecology

## 2017-11-01 ENCOUNTER — Other Ambulatory Visit: Payer: Self-pay

## 2017-11-01 ENCOUNTER — Other Ambulatory Visit (HOSPITAL_COMMUNITY)
Admission: RE | Admit: 2017-11-01 | Discharge: 2017-11-01 | Disposition: A | Discharge status: Home / Self Care | Payer: BC Managed Care – PPO | Source: Ambulatory Visit | Attending: Obstetrics & Gynecology | Admitting: Obstetrics & Gynecology

## 2017-11-01 ENCOUNTER — Ambulatory Visit (INDEPENDENT_AMBULATORY_CARE_PROVIDER_SITE_OTHER): Payer: BC Managed Care – PPO | Admitting: Obstetrics & Gynecology

## 2017-11-01 VITALS — BP 118/64 | HR 76 | Resp 18 | Ht 63.0 in | Wt 130.0 lb

## 2017-11-01 DIAGNOSIS — Z124 Encounter for screening for malignant neoplasm of cervix: Secondary | ICD-10-CM

## 2017-11-01 DIAGNOSIS — G4700 Insomnia, unspecified: Secondary | ICD-10-CM | POA: Diagnosis not present

## 2017-11-01 DIAGNOSIS — Z0000 Encounter for general adult medical examination without abnormal findings: Secondary | ICD-10-CM | POA: Diagnosis not present

## 2017-11-01 DIAGNOSIS — Z01411 Encounter for gynecological examination (general) (routine) with abnormal findings: Secondary | ICD-10-CM

## 2017-11-01 MED ORDER — AMITRIPTYLINE HCL 10 MG PO TABS: 10.0000 mg | ORAL_TABLET | Freq: Every evening | ORAL | 2 refills | Status: DC | PRN

## 2017-11-02 LAB — COMPREHENSIVE METABOLIC PANEL
ALT: 16 IU/L (ref 0–32)
AST: 18 IU/L (ref 0–40)
Albumin/Globulin Ratio: 1.8 (ref 1.2–2.2)
Albumin: 4.3 g/dL (ref 3.5–5.5)
Alkaline Phosphatase: 105 IU/L (ref 39–117)
BUN/Creatinine Ratio: 17 (ref 9–23)
BUN: 13 mg/dL (ref 6–24)
Bilirubin Total: 0.2 mg/dL (ref 0.0–1.2)
CO2: 26 mmol/L (ref 20–29)
Calcium: 10 mg/dL (ref 8.7–10.2)
Chloride: 101 mmol/L (ref 96–106)
Creatinine, Ser: 0.78 mg/dL (ref 0.57–1.00)
GFR calc Af Amer: 98 mL/min/{1.73_m2} (ref >59)
GFR calc non Af Amer: 85 mL/min/{1.73_m2} (ref >59)
Globulin, Total: 2.4 g/dL (ref 1.5–4.5)
Glucose: 87 mg/dL (ref 65–99)
Potassium: 4.6 mmol/L (ref 3.5–5.2)
Sodium: 141 mmol/L (ref 134–144)
Total Protein: 6.7 g/dL (ref 6.0–8.5)

## 2017-11-02 LAB — CBC
Hematocrit: 41.7 % (ref 34.0–46.6)
Hemoglobin: 13.5 g/dL (ref 11.1–15.9)
MCH: 29.7 pg (ref 26.6–33.0)
MCHC: 32.4 g/dL (ref 31.5–35.7)
MCV: 92 fL (ref 79–97)
Platelets: 247 10*3/uL (ref 150–450)
RBC: 4.55 x10E6/uL (ref 3.77–5.28)
RDW: 13 % (ref 12.3–15.4)
WBC: 6.6 10*3/uL (ref 3.4–10.8)

## 2017-11-02 LAB — LIPID PANEL
Chol/HDL Ratio: 3.4 ratio (ref 0.0–4.4)
Cholesterol, Total: 186 mg/dL (ref 100–199)
HDL: 55 mg/dL (ref >39)
LDL Calculated: 113 mg/dL — ABNORMAL HIGH (ref 0–99)
Triglycerides: 90 mg/dL (ref 0–149)
VLDL Cholesterol Cal: 18 mg/dL (ref 5–40)

## 2017-11-02 LAB — TSH: TSH: 3.12 u[IU]/mL (ref 0.450–4.500)

## 2017-11-02 LAB — VITAMIN D 25 HYDROXY (VIT D DEFICIENCY, FRACTURES): Vit D, 25-Hydroxy: 98 ng/mL (ref 30.0–100.0)

## 2017-11-05 ENCOUNTER — Telehealth: Payer: Self-pay | Admitting: *Deleted

## 2017-11-05 NOTE — Telephone Encounter (Signed)
LMTCB

## 2017-11-05 NOTE — Telephone Encounter (Signed)
-----   Message from Megan Salon, MD sent at 11/04/2017 10:01 PM EDT ----- Please let pt know her Vit D was 98.  If she is taking any extra vitamins or supplemental Vit D, she should stop it.    CBC and CMP were normal.  Thyroid was normal.  Lipids were fine.  Total 186 (normal).  LDLs mildly elevated at 113.  Were 119 last year.  HDLs good at 55.  This is all good.    Pap still pending

## 2017-11-06 ENCOUNTER — Encounter: Payer: Self-pay | Admitting: Obstetrics & Gynecology

## 2017-11-06 LAB — CYTOLOGY - PAP: Diagnosis: NEGATIVE

## 2017-11-06 NOTE — Telephone Encounter (Signed)
Left voicemail to call back. Second attempt.  

## 2017-11-07 NOTE — Telephone Encounter (Signed)
Patient sent the following correspondence through [place]. Routing to triage to assist patient with request.  ----- Message from [place], Generic sent at 11/06/2017 6:42 PM EDT -----    Please let me know what test results show. I see the numbers but what do they mean. Thank you

## 2017-11-13 ENCOUNTER — Other Ambulatory Visit: Payer: Self-pay | Admitting: Obstetrics & Gynecology

## 2017-11-13 DIAGNOSIS — Z1231 Encounter for screening mammogram for malignant neoplasm of breast: Secondary | ICD-10-CM

## 2017-11-25 ENCOUNTER — Telehealth: Payer: Self-pay | Admitting: *Deleted

## 2017-11-25 ENCOUNTER — Encounter: Payer: Self-pay | Admitting: Obstetrics & Gynecology

## 2017-11-25 NOTE — Telephone Encounter (Signed)
Left message to call Kree Rafter at [phone].  

## 2017-11-25 NOTE — Telephone Encounter (Signed)
-----   Message from [place], Generic sent at 11/25/2017 9:28 AM EDT -----    I wanted you to be aware of symptoms I experienced over the weekend. Sat. morning @6 :30 am I awoke w/severe cramping in my lower abdomen below my navel. I did not feel sick, but went to the bathroom. I had some diarrhea but mostly gas. The only way to receive comfort was to get in a ball. After taking medication for upset stomach, diarrhea I went to the bathroom again 2 more times. I ate very little during the day. The cramping subsided; however, there was a persistant pain that remained through Sunday. The pain was to the left and under rib cage extending in upper stomach area. Every once in a while I feel a nagging sensation in the latter area. It could possibly be something I ate on Friday. I have had food poisoning before and it wasn't the same symptoms.   I just wanted you to know due to our conversation during my visit in May for my annual exam regarding Ovarian Cancer. Thanks for your response to this matter. Dana Cook, (August 19, 1960), Milus Glazier

## 2017-11-25 NOTE — Telephone Encounter (Signed)
Spoke with patient. Patient states she is feeling much better today, still has a intermittent, nagging pain in LUQ, diminishes with ambulation. Last BM 6/15, soft followed by liquid.   Denies fever/chills, N/V or any GYN complaints.   Instructed patient to f/u with PCP should symptoms not completely resolve or new symptoms develop. If it is then determined further evaluation is needed with GYN, return call to office for scheduling. Advised Dr. Sabra Heck will review, I will return call with any additional recommendations.  Routing to provider for final review. Patient is agreeable to disposition. Will close encounter.

## 2017-11-25 NOTE — Telephone Encounter (Signed)
Spoke with patient, advised as seen below per Dr. Sabra Heck. Patient verbalizes understanding and is agreeable. Will close encounter.

## 2017-11-25 NOTE — Telephone Encounter (Signed)
If pain persists after BMs normalize, I'd recommend she have a pelvic ultrasound.  Just please let her know to keep me posted.  Thanks.

## 2017-12-02 ENCOUNTER — Telehealth: Payer: Self-pay | Admitting: Obstetrics & Gynecology

## 2017-12-02 NOTE — Telephone Encounter (Signed)
Spoke with patient. Patient states that nagging pain is persisting from last week. Pain extends from under her rib cage to her pelvis. Bowel movements has normalized. Last BM this morning. Denies any fever, chills, diarrhea, or vomiting. Advised per Dr.Miller's recommendation she will need a PUS. Offered appointment this week, but patient declines. Appointment scheduled for 12/10/2017 at 3:30 pm with 4 pm consult with Dr.Miller. Patient is agreeable to date and time. Aware if symptoms worsen or develops new symptoms will need to be seen earlier for evaluation.  Routing to provider for final review. Patient agreeable to disposition. Will close encounter.

## 2017-12-02 NOTE — Telephone Encounter (Signed)
Patient stated that she spoke with someone last week regarding an issue she is having. She was told to call back if the symptoms continued, and they have continued.

## 2017-12-10 ENCOUNTER — Other Ambulatory Visit (INDEPENDENT_AMBULATORY_CARE_PROVIDER_SITE_OTHER): Payer: BC Managed Care – PPO

## 2017-12-10 ENCOUNTER — Other Ambulatory Visit: Payer: Self-pay

## 2017-12-10 ENCOUNTER — Encounter: Payer: Self-pay | Admitting: Obstetrics & Gynecology

## 2017-12-10 ENCOUNTER — Other Ambulatory Visit: Payer: Self-pay | Admitting: *Deleted

## 2017-12-10 ENCOUNTER — Ambulatory Visit: Payer: BC Managed Care – PPO | Admitting: Obstetrics & Gynecology

## 2017-12-10 VITALS — BP 126/74 | HR 80 | Resp 14 | Ht 63.0 in | Wt 130.0 lb

## 2017-12-10 DIAGNOSIS — K8020 Calculus of gallbladder without cholecystitis without obstruction: Secondary | ICD-10-CM | POA: Diagnosis not present

## 2017-12-10 DIAGNOSIS — R102 Pelvic and perineal pain: Secondary | ICD-10-CM

## 2017-12-10 DIAGNOSIS — D251 Intramural leiomyoma of uterus: Secondary | ICD-10-CM | POA: Diagnosis not present

## 2017-12-10 DIAGNOSIS — R1013 Epigastric pain: Secondary | ICD-10-CM | POA: Insufficient documentation

## 2017-12-10 NOTE — Progress Notes (Signed)
57 y.o. G0P0000 Single Caucasian female here for pelvic ultrasound due to episode of abdominal cramping that occurred in mid June.  She did have some associated gas and bloating.  Pain was mid-epigastric and on the upper left side.  She has been keeping track of particular foods as she does have some milder recurrence of symptoms with eating.  .  Patient's last menstrual period was 12/10/2011 (approximate).  Contraception: PMP  Findings:  UTERUS: 5.9 x 4.1 x 2.3cm with two small intramural fibroids EMS:1.20mm ADNEXA: Left ovary: 1.9 x 0.9 x 0.7cm       Right ovary: 2.1 x 1.3 x 1.0cm CUL DE SAC: no free fluid  Additional findings:  61mm gallstone noted.  Discussion:  Findings reviewed with pt.  No gyn source is pain noted today with ultrasound however gallbladder finding reviewed with pt.  Feel she needs general surgery consultation.  She would like to discuss with family and then let me know who she would like to be referred to at [facility].  Assessment:  Epigastric pain/bloating Gallstone Small intramural fibroids  Plan:  Pt will let me know which doctor to refer her to for possible surgical treatment of gallstone.  ~15 minutes spent with patient >50% of time was in face to face discussion of above.

## 2017-12-11 ENCOUNTER — Telehealth: Payer: Self-pay | Admitting: Obstetrics & Gynecology

## 2017-12-11 DIAGNOSIS — K8020 Calculus of gallbladder without cholecystitis without obstruction: Secondary | ICD-10-CM

## 2017-12-11 NOTE — Telephone Encounter (Signed)
Patient spoke with Dr. Sabra Heck yesterday to discuss gallbladder surgery. Patient discussed with her family and has made the decision to have it removed. Dr. Sabra Heck told the patient to call back today once she made her decision and she could suggest a surgeon.

## 2017-12-11 NOTE — Telephone Encounter (Signed)
Call back to patient. States she has reviewed with family and are agreeable to referral to Dr Johney Maine at [place] as discussed with Dr Sabra Heck. Father having heart surgery, needs to avoid appointment on 12-26-17.  Advised will make referral and she should call back if has not hears anything in one week.   Routing to provider for final review. Will close encounter.

## 2017-12-19 ENCOUNTER — Encounter: Payer: Self-pay | Admitting: Obstetrics & Gynecology

## 2017-12-19 ENCOUNTER — Telehealth: Payer: Self-pay | Admitting: *Deleted

## 2017-12-19 NOTE — Telephone Encounter (Signed)
See phone note

## 2017-12-19 NOTE — Telephone Encounter (Signed)
My Chart message from patient:  ----- Message from [place], Generic sent at 12/19/2017 10:14 AM EDT -----    I wanted to know if you had heard anything from the surgeon you were going to contact regarding my gall bladder. Thank you, Jenene Slicker

## 2017-12-19 NOTE — Telephone Encounter (Signed)
Please check on referral to [facility] and notify patient.

## 2017-12-26 NOTE — Telephone Encounter (Signed)
Has appointment been scheduled?

## 2017-12-27 NOTE — Telephone Encounter (Signed)
appointment scheduled with Dr. Johney Maine on 01/06/18 at [facility].

## 2018-01-09 ENCOUNTER — Ambulatory Visit (HOSPITAL_COMMUNITY)
Admission: RE | Admit: 2018-01-09 | Discharge: 2018-01-09 | Disposition: A | Discharge status: Home / Self Care | Payer: BC Managed Care – PPO | Source: Ambulatory Visit | Attending: Obstetrics & Gynecology | Admitting: Obstetrics & Gynecology

## 2018-01-09 DIAGNOSIS — Z1231 Encounter for screening mammogram for malignant neoplasm of breast: Secondary | ICD-10-CM | POA: Insufficient documentation

## 2018-03-26 ENCOUNTER — Encounter (INDEPENDENT_AMBULATORY_CARE_PROVIDER_SITE_OTHER): Payer: Self-pay | Admitting: Physical Medicine and Rehabilitation

## 2018-03-27 ENCOUNTER — Other Ambulatory Visit (INDEPENDENT_AMBULATORY_CARE_PROVIDER_SITE_OTHER): Payer: Self-pay | Admitting: Physical Medicine and Rehabilitation

## 2018-03-27 DIAGNOSIS — M5116 Intervertebral disc disorders with radiculopathy, lumbar region: Secondary | ICD-10-CM

## 2018-03-27 DIAGNOSIS — M5416 Radiculopathy, lumbar region: Secondary | ICD-10-CM

## 2018-03-27 DIAGNOSIS — M419 Scoliosis, unspecified: Secondary | ICD-10-CM

## 2018-03-27 NOTE — Progress Notes (Signed)
Referral to Dr. Erline Levine, neurosurgery consultation.

## 2018-04-14 ENCOUNTER — Encounter (INDEPENDENT_AMBULATORY_CARE_PROVIDER_SITE_OTHER): Payer: Self-pay | Admitting: Physical Medicine and Rehabilitation

## 2018-04-17 ENCOUNTER — Encounter: Payer: Self-pay | Admitting: Obstetrics & Gynecology

## 2018-04-17 ENCOUNTER — Telehealth: Payer: Self-pay | Admitting: Obstetrics & Gynecology

## 2018-04-17 NOTE — Telephone Encounter (Signed)
Message left to return call to Triage Nurse at [phone].    

## 2018-04-17 NOTE — Telephone Encounter (Signed)
Patient sent the following message through [place]. Routing to triage to assist patient with request.   I have been having a nagging sensation under my armpit, and around my rib cage towards my back on the left side. It sometimes goes under breast on that side. It is mostly under my armpit. My chest doesn't hurt, nor do I have pain down my arm, so I don't think its heart related. Could be strained muscle. Is this something I should see Dr. Sabra Heck about or should I contact my GP. thanks, Dana Cook  January 12, 1961       I can be reached @ either [phone]; or [phone]

## 2018-04-17 NOTE — Telephone Encounter (Signed)
Patient returned call. Patient states that for the last 2 weeks has had an "nagging pain" that starts under her arm and radiates to her back. Patient states the pain is not constant. Usually starts at night and will occasionally hurt throughout the day. States she is not having to take anything for pain. Patient denies pain/ lumps in her breast. States she is trying to rule things out. Unsure if this could be something related to her lymph nodes. Also states she had pneumonia once that started with somewhat of the same symptoms. RN advised to follow up with PCP, Dr. Elease Hashimoto due to nature of symptoms. RN offered to call and make appointment for patient, but patient states she will call. RN advised would update Dr. Sabra Heck. Patient agreeable.   Routing to provider and will close encounter.

## 2018-05-05 ENCOUNTER — Encounter: Payer: Self-pay | Admitting: Family Medicine

## 2018-05-05 ENCOUNTER — Ambulatory Visit (INDEPENDENT_AMBULATORY_CARE_PROVIDER_SITE_OTHER): Payer: BC Managed Care – PPO

## 2018-05-05 ENCOUNTER — Ambulatory Visit (INDEPENDENT_AMBULATORY_CARE_PROVIDER_SITE_OTHER): Payer: BC Managed Care – PPO | Admitting: Family Medicine

## 2018-05-05 VITALS — BP 120/70 | HR 79 | Temp 98.3°F | Wt 130.3 lb

## 2018-05-05 DIAGNOSIS — R0789 Other chest pain: Secondary | ICD-10-CM | POA: Diagnosis not present

## 2018-05-05 DIAGNOSIS — R5383 Other fatigue: Secondary | ICD-10-CM | POA: Diagnosis not present

## 2018-05-05 NOTE — Progress Notes (Signed)
Subjective:     Patient ID: Dana Cook, female   DOB: 1960-10-13, 57 y.o.   MRN: [id number]  HPI Patient is seen with complaints of increased fatigue and what she describes as "chest tightness" and upper back pain with deep breathing.  No dyspnea.  No cough.  No shaking chills.  She states she felt very similar previously years ago when she had pneumonia.  She has no exertional symptoms whatsoever.  Symptoms started couple weeks ago after doing some yard work.  She initially thought this may be more musculoskeletal.  Symptoms are triggered with deep breathing and movement.  No leg pain or any leg edema  She has lost substantial weight over the past few years due to her efforts through weight watchers.  Her weight is stable since last July.  She had multiple labs done per GYN last May including TSH and CBC which were normal.  Patient denies any abdominal pain.  No nausea or vomiting.  She has had a lot of stress issues recently and difficulty falling asleep and thinks that may be contributing to her fatigue issues.  Denies depression.  Past Medical History:  Diagnosis Date  . Abnormal Pap smear of cervix 2008  . Allergy   . Atypical glandular cells of undetermined significance (AGUS) on cervical Pap smear 2008   TWICE, D&C negative  . Chicken pox   . Chronic sinusitis   . Osteoarthritis    Past Surgical History:  Procedure Laterality Date  . COLPOSCOPY  2015   LSIL  . DILATION AND CURETTAGE OF UTERUS  2008  . FINGER FRACTURE SURGERY Right 1983   5th finger    reports that she has never smoked. She has never used smokeless tobacco. She reports that she does not drink alcohol or use drugs. family history includes Heart disease in her father; Hypertension in her father, mother, and paternal grandmother. Allergies  Allergen Reactions  . Erythromycin Nausea And Vomiting    Stomach cramps     Review of Systems  Constitutional: Positive for fatigue. Negative for appetite change,  fever and unexpected weight change.  Respiratory: Negative for cough, shortness of breath and wheezing.   Cardiovascular: Negative for palpitations and leg swelling.  Gastrointestinal: Negative for abdominal pain, nausea and vomiting.  Genitourinary: Negative for dysuria and flank pain.  Neurological: Negative for weakness.       Objective:   Physical Exam  Constitutional: She appears well-developed and well-nourished.  Neck: Neck supple.  Cardiovascular: Normal rate and regular rhythm.  Pulmonary/Chest: Effort normal and breath sounds normal.  Abdominal: Soft. She exhibits no ascites and no mass. There is no splenomegaly or hepatomegaly. There is no tenderness. There is no rebound and no guarding.  Musculoskeletal:       Right lower leg: She exhibits no edema.  Lymphadenopathy:    She has no cervical adenopathy.       Assessment:     Patient presents with symptoms of fatigue and "chest tightness "with predominantly bilateral back pain.  Symptoms are worse with deep breathing and movement.  Suspect musculoskeletal.  She does have prominent scoliosis.   she has had no exertional symptoms whatsoever    Plan:     -Obtain chest x-ray to further evaluate-shows normal heart size with no acute cardiopulmonary disease -We have suggested some things to try to help with her sleep such as over-the-counter melatonin. -Follow-up immediately for any exertional chest tightness or other concerns  Eulas Post MD [facility]  at [facility]

## 2018-05-05 NOTE — Patient Instructions (Signed)
Follow up for any fever, increased shortness of breath, or any exertional discomfort.

## 2018-05-07 ENCOUNTER — Ambulatory Visit: Payer: BC Managed Care – PPO | Admitting: Family Medicine

## 2018-06-24 ENCOUNTER — Other Ambulatory Visit: Payer: Self-pay | Admitting: Neurosurgery

## 2018-06-24 ENCOUNTER — Other Ambulatory Visit (HOSPITAL_COMMUNITY): Payer: Self-pay | Admitting: Neurosurgery

## 2018-06-24 DIAGNOSIS — M5416 Radiculopathy, lumbar region: Secondary | ICD-10-CM

## 2018-07-04 ENCOUNTER — Ambulatory Visit (HOSPITAL_COMMUNITY)
Admission: RE | Admit: 2018-07-04 | Discharge: 2018-07-04 | Disposition: A | Discharge status: Home / Self Care | Payer: BC Managed Care – PPO | Source: Ambulatory Visit | Attending: Neurosurgery | Admitting: Neurosurgery

## 2018-07-04 DIAGNOSIS — M5416 Radiculopathy, lumbar region: Secondary | ICD-10-CM | POA: Diagnosis not present

## 2018-07-31 ENCOUNTER — Other Ambulatory Visit: Payer: Self-pay | Admitting: Neurosurgery

## 2018-08-01 ENCOUNTER — Other Ambulatory Visit: Payer: Self-pay | Admitting: *Deleted

## 2018-08-05 ENCOUNTER — Telehealth: Payer: Self-pay | Admitting: Vascular Surgery

## 2018-08-05 NOTE — Telephone Encounter (Signed)
-----   Message from Willy Eddy, RN sent at 07/31/2018  3:04 PM EST ----- Regarding: OFFICE APPT. Please schedule an office appointment with Dr. Scot Dock. ALIF scheduled with Dr. Vertell Limber for 09/02/2018. Remind patient to bring any films related to this surgery with her to this appointment.

## 2018-08-05 NOTE — Telephone Encounter (Signed)
sch appt lvm mld ltr 08/20/2018 315pm f/u MD

## 2018-08-16 NOTE — H&P (Signed)
Patient ID:   [id number] Patient: Shaneal Barasch  Date of Birth: [birth year] Visit Type: Office Visit   Date: 07/21/2018 10:45 AM Provider: Marchia Meiers. Vertell Limber MD   This 58 year old female presents for back and R hip and leg.  HISTORY OF PRESENT ILLNESS:  1.  back, R hip and leg  Patient returns to review her MRI. Patient endorses unchanged right lower extremity pain into the top of her right. Patient also notes continued back pain but  reports that this pain is not as severe as her right lower extremity pain and describes it as "muscle" pain.  07/04/18 L-spine MRI without contrast Right foraminal disc protrusion L2-3 unchanged. Right foraminal disc protrusion L3-4 unchanged with mild spinal stenosis. Mild spinal stenosis at L4-5. Marked right foraminal encroachment due to spurring L5-S1. Overall no change from the prior MRI.         Medical/Surgical/Interim History Reviewed, no change.  Last detailed document date:06/16/2018.     Family History:  Reviewed, no changes.  Last detailed document date:06/16/2018.   Social History: Reviewed, no changes. Last detailed document date: 06/16/2018.    MEDICATIONS: (added, continued or stopped this visit) Started Medication Directions Instruction Stopped   amitriptyline 10 mg tablet take 1 tablet by oral route  every bedtime     ropinirole 0.5 mg tablet take 1 tablet by oral route  every bedtime       ALLERGIES: Ingredient Reaction Medication Name Comment  ERYTHROMYCIN BASE         PHYSICAL EXAM:   Vitals Date Temp F BP Pulse Ht In Wt Lb BMI BSA Pain Score  07/21/2018  135/72 64 64 132 22.66  6/10      IMPRESSION:   L-spine MRI without contrast reveals significant right foraminal encroachment at L5-S1, dextro-convex scoliosis, right foraminal disc protrusion at L3-4 with spinal stenosis, mild spinal stenosis at L4-5.  Reviewed previous AP + Lateral scoliosis X-rays - AP + Lateral scoliosis X-rays reveal  dextro-convex lumbar scoliosis.  Upon examination, 4/5 right EHL.  Patient was noted to be weak in her right lower extremity during her physical examination. Recommended surgical intervention - recommended L5-S1 ALIF and advised patient that this would be a much less extensive surgical option than correcting her scoliosis. Advised patient that correcting her curvature would significantly reduce her flexibility within the lumbar spine but this would not occur with an L5-S1 ALIF. Advised patient that there is potential to develop further problems at other disc levels if an L5-S1 ALIF is performed. Advised patient that there would be a vascular surgeon present during surgery to assist. Patient wished to proceed with L5-S1 ALIF.  PLAN:  1. Nurse education given 2. LSO brace fitted 3. L5-S1 ALIF to be scheduled 4. Follow-up after surgery  Orders: Diagnostic Procedures: Assessment Procedure  M54.16 Lumbar Spine- AP/Lat  Instruction(s)/Education: Assessment Instruction  R03.0 Hypertension education  Miscellaneous: Assessment   M48.061 Aspen Lo Sag Rigid Panel Quick   Assessment/Plan   # Detail Type Description   1. Assessment Lumbar foraminal stenosis (M48.061).   Plan Orders Aspen Lo Sag Rigid Panel Quick.       2. Assessment Lumbar radiculopathy (M54.16).       3. Assessment Scoliosis (and kyphoscoliosis), idiopathic (M41.20).       4. Assessment Elevated blood-pressure reading, w/o diagnosis of htn (R03.0).         Pain Management Plan Pain Scale: 6/10. Method: Numeric Pain Intensity Scale. Location: back. Onset: 06/17/2011. Duration: varies. Quality: discomforting.  Pain management follow-up plan of care: Patient taking medication as prescribed.  Fall Risk Plan The patient has not fallen in the last year.              Provider:  Marchia Meiers. Vertell Limber MD  07/22/2018 05:10 PM Dictation edited by: Mirian Mo    CC Providers: [place],  [place]  [id number]   Amandy Chubbuck MD  [address] [place], [place] [address]               Electronically signed by Marchia Meiers. Vertell Limber MD on 07/23/2018 07:41 AM  Patient ID:   [id number] Patient: Jenene Slicker  Date of Birth: 11-06-1960 Visit Type: Office Visit   Date: 06/16/2018 01:00 PM Provider: Marchia Meiers. Vertell Limber MD   This 58 year old female presents for back and R hip and leg pain.  HISTORY OF PRESENT ILLNESS:  1.  back, R hip and leg pain  Jenene Slicker, 58 year old retired female, visits for evaluation.  Patient reports arthritic pain since 2013, with lumbar and right hip/leg pain increasing over the past 2 years.  She notes numbness and tingling in the right leg and foot, stating the right leg has given way twice.  She describes pain as a "hot poker" through the right buttock and into the leg. Pain is relieved by leaning forward. Patient rates current pain at 8.5/10 up to 10/10 at it's worst.  CBD oil / homeopathic topical rubs offer little relief  Physical therapy, dry needle in 2017 and 2018. Home exercise continues Epidural injections 2014-2017  History:  LA, GERD, RLS, gallstone Surgical history:  D&C 2007    MRI lumbar 10/30/2016 on [facility] on [place]          PAST MEDICAL/SURGICAL HISTORY:   (Detailed)    Disease/disorder Onset Date Management Date Comments    Back surgery  JMP 06/16/2018 -  Arthritis      Reflux    JMP 06/16/2018 -     Family History:  (Detailed)   Social History:  (Detailed) Tobacco use reviewed. Preferred language is Unknown.   Tobacco use status: Current non-smoker. Smoking status: Never smoker.  SMOKING STATUS Type Smoking Status Usage Per Day Years Used Total Pack Years   Never smoker      TOBACCO/VAPING EXPOSURE No passive vaping exposure. No passive smoke exposure.       MEDICATIONS: (added, continued or stopped this visit) Started Medication  Directions Instruction Stopped   amitriptyline 10 mg tablet take 1 tablet by oral route  every bedtime     ropinirole 0.5 mg tablet take 1 tablet by oral route  every bedtime       ALLERGIES: Ingredient Reaction Medication Name Comment  ERYTHROMYCIN BASE      Reviewed, updated.   REVIEW OF SYSTEMS   See scanned patient registration form, dated, signed and dated on   Review of Systems Details System Neg/Pos Details  Constitutional Negative Chills, Fatigue, Fever, Malaise, Night sweats, Weight gain and Weight loss.  ENMT Negative Ear drainage, Hearing loss, Nasal drainage, Otalgia, Sinus pressure and Sore throat.  Eyes Negative Eye discharge, Eye pain and Vision changes.  Respiratory Negative Chronic cough, Cough, Dyspnea, Known TB exposure and Wheezing.  Cardio Negative Chest pain, Claudication, Edema and Irregular heartbeat/palpitations.  GI Negative Abdominal pain, Blood in stool, Change in stool pattern, Constipation, Decreased appetite, Diarrhea, Heartburn, Nausea and Vomiting.  GU Negative Dysuria, Hematuria, Polyuria (Genitourinary), Urinary frequency,  Urinary incontinence and Urinary retention.  Endocrine Negative Cold intolerance, Heat intolerance, Polydipsia and Polyphagia.  Neuro Positive Numbness in extremity.  Psych Negative Anxiety, Depression and Insomnia.  Integumentary Negative Brittle hair, Brittle nails, Change in shape/size of mole(s), Hair loss, Hirsutism, Hives, Pruritus, Rash and Skin lesion.  MS Positive Back pain.  Hema/Lymph Negative Easy bleeding, Easy bruising and Lymphadenopathy.  Allergic/Immuno Negative Contact allergy, Environmental allergies, Food allergies and Seasonal allergies.  Reproductive Negative Breast discharge, Breast lumps, Dysmenorrhea, Dyspareunia, History of abnormal PAP smear, Hot flashes, Irregular menses and Vaginal discharge.   PHYSICAL EXAM:   Vitals Date Temp F BP Pulse Ht In Wt Lb BMI BSA Pain Score  06/16/2018  114/79 94 64  129 22.14  10/10    PHYSICAL EXAM Details General Level of Distress: no acute distress Overall Appearance: normal  Head and Face  Right Left  Fundoscopic Exam:  normal normal    Cardiovascular Cardiac: regular rate and rhythm without murmur  Right Left  Carotid Pulses: normal normal  Respiratory Lungs: clear to auscultation  Neurological Orientation: normal Recent and Remote Memory: normal Attention Span and Concentration:   normal Language: normal Fund of Knowledge: normal  Right Left Sensation: normal normal Upper Extremity Coordination: normal normal  Lower Extremity Coordination: normal normal  Musculoskeletal Gait and Station: normal  Right Left Upper Extremity Muscle Strength: normal normal Lower Extremity Muscle Strength: normal normal Upper Extremity Muscle Tone:  normal normal Lower Extremity Muscle Tone: normal normal   Motor Strength Upper and lower extremity motor strength was tested in the clinically pertinent muscles. Any abnormal findings will be noted below.   Right Left EHL: 4/5    Deep Tendon Reflexes  Right Left Biceps: normal normal Triceps: normal normal Brachioradialis: normal normal Patellar: normal normal Achilles: normal normal  Cranial Nerves II. Optic Nerve/Visual Fields: normal III. Oculomotor: normal IV. Trochlear: normal V. Trigeminal: normal VI. Abducens: normal VII. Facial: normal VIII. Acoustic/Vestibular: normal IX. Glossopharyngeal: normal X. Vagus: normal XI. Spinal Accessory: normal XII. Hypoglossal: normal  Motor and other Tests Lhermittes: negative Rhomberg: negative Pronator drift: absent     Right Left Hoffman's: normal normal Clonus: normal normal Babinski: normal normal SLR: positive at 60 degrees negative   Additional Findings:  Upon examination, able to touch toes, pain to right sciatic notch with palpation, able to stand on heels and toes, full strength in bilateral upper extremities,  4/5 right hip abductors.   DIAGNOSTIC RESULTS:   10/30/16 L-spine MRI without contrast Some progression of degenerative disease at L4-5 where facet arthropathy has progressed and there is a shallow disc bulge without central canal or foraminal stenosis. No change in marked right foraminal narrowing at L5-S1. Convex right scoliosis.    IMPRESSION:   AP + Lateral scoliosis X-rays reveal dextroconvex lumbar scoliosis, reversal of lordotic lumbar curvature, 14.8 mm on the left side foramen at L5-S1, 7.5 mm foramen on the right side at L5-S1.   L-spine MRI without contrast reveals degeneration at L5-S1 with right neuroforaminal narrowing. Recommended new MRI due to age of previous MRI. Recommended surgical intervention - calculations to be performed and surgical plan created - discussed recovery from surgery. Discussed risks, length, extent of surgery. Advised patient that surgery will staged and would require aid of vascular surgeon. New L-spine MRI with and without contrast to be scheduled. Patient will return after imaging is complete.  Upon examination, able to touch toes, pain to right sciatic notch with palpation, able to stand on heels and toes, full  strength in bilateral upper extremities, 4/5 right EHL, positive SLR at 60 degrees on the right, negative SLR on left, 4/5 right hip abductors.  PLAN:  1. Nurse education given 2. L-spine MRI without contrast scheduled 3. Follow-up after imaging is complete  Orders: Diagnostic Procedures: Assessment Procedure  M41.20 Scoliosis- AP/Lat  M54.16 Lumbar Spine- AP/Lat/Flex/Ex  M54.16 MRI Spine/lumb W/o Contrast   Completed Orders (this encounter) Order Details Reason Side Interpretation Result Initial Treatment Date Region  Scoliosis- AP/Lat 1 of 2     06/16/2018 All Levels to All Levels  Lumbar Spine- AP/Lat/Flex/Ex 2 of 2     06/16/2018 All Levels to All Levels   Assessment/Plan   # Detail Type Description   1. Assessment Scoliosis  (and kyphoscoliosis), idiopathic (M41.20).       2. Assessment Lumbar radiculopathy (M54.16).         Pain Management Plan Location: back, hip and Rt leg. Onset: 06/17/2011. Duration: varies. Quality: discomforting. Pain management follow-up plan of care: Patient is taking OTC pain relieves for relief.              Provider:  Marchia Meiers. Vertell Limber MD  06/16/2018 08:06 PM Dictation edited by: Mirian Mo    CC Providers: Erline Levine MD  [address] [place], [place] [address]               Electronically signed by Marchia Meiers. Vertell Limber MD on 06/21/2018 01:23 PM

## 2018-08-19 NOTE — Pre-Procedure Instructions (Signed)
[place]  08/19/2018      [place], [place] - [address] [place] [address] Phone: [phone] Fax: [phone]    Your procedure is scheduled on Tuesday March 24th.  Report to [facility] Admitting entrance "A" at 5:30 A.M.  Call this number if you have problems the morning of surgery:  [phone]   Remember:  Do not eat or drink after midnight.     Take these medicines the morning of surgery with A SIP OF WATER NONE    7 days prior to surgery STOP taking any Aspirin(unless otherwise instructed by your surgeon), Aleve, Naproxen, Ibuprofen, Motrin, Advil, Goody's, BC's, all herbal medications, fish oil, and all vitamins    Do not wear jewelry, make-up or nail polish.  Do not wear lotions, powders, or perfumes, or deodorant.  Do not shave 48 hours prior to surgery.   Do not bring valuables to the hospital.  [facility] is not responsible for any belongings or valuables.  Contacts, dentures or bridgework may not be worn into surgery.  Leave your suitcase in the car.  After surgery it may be brought to your room.  For patients admitted to the hospital, discharge time will be determined by your treatment team.  Patients discharged the day of surgery will not be allowed to drive home.   [place]- Preparing For Surgery  Before surgery, you can play an important role. Because skin is not sterile, your skin needs to be as free of germs as possible. You can reduce the number of germs on your skin by washing with CHG (chlorahexidine gluconate) Soap before surgery.  CHG is an antiseptic cleaner which kills germs and bonds with the skin to continue killing germs even after washing.    Oral Hygiene is also important to reduce your risk of infection.  Remember - BRUSH YOUR TEETH THE MORNING OF SURGERY WITH YOUR REGULAR TOOTHPASTE  Please do not use if you have an allergy to CHG or antibacterial soaps. If  your skin becomes reddened/irritated stop using the CHG.  Do not shave (including legs and underarms) for at least 48 hours prior to first CHG shower. It is OK to shave your face.  Please follow these instructions carefully.   1. Shower the NIGHT BEFORE SURGERY and the MORNING OF SURGERY with CHG.   2. If you chose to wash your hair, wash your hair first as usual with your normal shampoo.  3. After you shampoo, rinse your hair and body thoroughly to remove the shampoo.  4. Use CHG as you would any other liquid soap. You can apply CHG directly to the skin and wash gently with a scrungie or a clean washcloth.   5. Apply the CHG Soap to your body ONLY FROM THE NECK DOWN.  Do not use on open wounds or open sores. Avoid contact with your eyes, ears, mouth and genitals (private parts). Wash Face and genitals (private parts)  with your normal soap.  6. Wash thoroughly, paying special attention to the area where your surgery will be performed.  7. Thoroughly rinse your body with warm water from the neck down.  8. DO NOT shower/wash with your normal soap after using and rinsing off the CHG Soap.  9. Pat yourself dry with a CLEAN TOWEL.  10. Wear CLEAN PAJAMAS to bed the night before surgery, wear comfortable clothes the morning of surgery  11. Place CLEAN SHEETS on your bed  the night of your first shower and DO NOT SLEEP WITH PETS.    Day of Surgery: Shower as stated above. Do not apply any deodorants/lotions.  Please wear clean clothes to the hospital/surgery center.   Remember to brush your teeth WITH YOUR REGULAR TOOTHPASTE.  Please read over the following fact sheets that you were given.

## 2018-08-20 ENCOUNTER — Other Ambulatory Visit: Payer: Self-pay

## 2018-08-20 ENCOUNTER — Encounter: Payer: Self-pay | Admitting: Vascular Surgery

## 2018-08-20 ENCOUNTER — Ambulatory Visit (INDEPENDENT_AMBULATORY_CARE_PROVIDER_SITE_OTHER): Payer: BC Managed Care – PPO | Admitting: Vascular Surgery

## 2018-08-20 ENCOUNTER — Encounter (HOSPITAL_COMMUNITY)
Admission: RE | Admit: 2018-08-20 | Discharge: 2018-08-20 | Disposition: A | Discharge status: Home / Self Care | Payer: BC Managed Care – PPO | Source: Ambulatory Visit | Attending: Neurosurgery | Admitting: Neurosurgery

## 2018-08-20 ENCOUNTER — Encounter (HOSPITAL_COMMUNITY): Payer: Self-pay

## 2018-08-20 VITALS — BP 106/75 | HR 92 | Temp 99.7°F | Resp 16 | Ht 63.0 in | Wt 131.0 lb

## 2018-08-20 DIAGNOSIS — Z01812 Encounter for preprocedural laboratory examination: Secondary | ICD-10-CM | POA: Diagnosis present

## 2018-08-20 DIAGNOSIS — M5136 Other intervertebral disc degeneration, lumbar region: Secondary | ICD-10-CM | POA: Diagnosis not present

## 2018-08-20 DIAGNOSIS — M5137 Other intervertebral disc degeneration, lumbosacral region: Secondary | ICD-10-CM

## 2018-08-20 LAB — BASIC METABOLIC PANEL
Anion gap: 8 (ref 5–15)
BUN: 13 mg/dL (ref 6–20)
CO2: 27 mmol/L (ref 22–32)
Calcium: 9.3 mg/dL (ref 8.9–10.3)
Chloride: 104 mmol/L (ref 98–111)
Creatinine, Ser: 0.75 mg/dL (ref 0.44–1.00)
GFR calc Af Amer: >60 mL/min (ref >60)
GFR calc non Af Amer: >60 mL/min (ref >60)
Glucose, Bld: 80 mg/dL (ref 70–99)
Potassium: 4.1 mmol/L (ref 3.5–5.1)
Sodium: 139 mmol/L (ref 135–145)

## 2018-08-20 LAB — CBC
HCT: 40.3 % (ref 36.0–46.0)
Hemoglobin: 13.5 g/dL (ref 12.0–15.0)
MCH: 29.5 pg (ref 26.0–34.0)
MCHC: 33.5 g/dL (ref 30.0–36.0)
MCV: 88 fL (ref 80.0–100.0)
Platelets: 239 10*3/uL (ref 150–400)
RBC: 4.58 MIL/uL (ref 3.87–5.11)
RDW: 12.2 % (ref 11.5–15.5)
WBC: 6.9 10*3/uL (ref 4.0–10.5)
nRBC: 0 % (ref 0.0–0.2)

## 2018-08-20 LAB — TYPE AND SCREEN
ABO/RH(D): O POS
Antibody Screen: NEGATIVE

## 2018-08-20 LAB — ABO/RH: ABO/RH(D): O POS

## 2018-08-20 LAB — SURGICAL PCR SCREEN
MRSA, PCR: NEGATIVE
Staphylococcus aureus: NEGATIVE

## 2018-08-20 NOTE — Progress Notes (Signed)
REASON FOR CONSULT:    To evaluate for anterior retroperitoneal exposure of L5-S1.  The consult is requested by Dr. Vertell Limber.  ASSESSMENT & PLAN:   DEGENERATIVE DISC DISEASE L5-S1: The patient appears to be a good candidate for anterior retroperitoneal exposure of L5-S1. I have reviewed our role in exposure of the spine in order to allow anterior lumbar interbody fusion at the appropriate levels. We have discussed the potential complications of surgery, including but not limited to, arterial or venous injury, thrombosis, or bleeding. We have also discussed the potential risks of wound healing problems, the development of a hernia, nerve injury, leg swelling, or other unpredictable medical problems.  All the patient's questions were answered and they are agreeable to proceed.  Her surgery is scheduled for 09/02/2018.  Deitra Mayo, MD, FACS Beeper [phone] Office: [phone]   HPI:   Dana Cook is a pleasant 58 y.o. female, who developed right buttock pain back in 2007.  She had an extensive work-up and ultimately was found to have significant degenerative disc disease of her back with scoliosis.  She has tried physical therapy, injection therapy and dry needling treatments.  She continues to have significant pain.  She was evaluated and felt to be a good candidate for anterior lumbar interbody fusion at the L5-S1 level.  We were consulted to provide anterior retroperitoneal exposure.  Her symptoms radiate down to her foot on the right side.  Her symptoms are aggravated by activity and sitting at times.  There are really no alleviating factors.  She is had no previous abdominal surgery.  She really does not have any risk factors for peripheral vascular disease.  She denies any history of diabetes, hypertension, hypercholesterolemia, family history of premature cardiovascular disease, or smoking history.  Past Medical History:  Diagnosis Date  . Abnormal Pap smear of cervix 2008   . Allergy   . Atypical glandular cells of undetermined significance (AGUS) on cervical Pap smear 2008   TWICE, D&C negative  . Chicken pox   . Chronic sinusitis   . Osteoarthritis     Family History  Problem Relation Age of Onset  . Hypertension Mother   . Heart disease Father   . Hypertension Father   . Hypertension Paternal Grandmother   . Colon cancer Neg Hx     SOCIAL HISTORY: Social History   Socioeconomic History  . Marital status: Single    Spouse name: Not on file  . Number of children: Not on file  . Years of education: Not on file  . Highest education level: Not on file  Occupational History  . Not on file  Social Needs  . Financial resource strain: Not on file  . Food insecurity:    Worry: Not on file    Inability: Not on file  . Transportation needs:    Medical: Not on file    Non-medical: Not on file  Tobacco Use  . Smoking status: Never Smoker  . Smokeless tobacco: Never Used  Substance and Sexual Activity  . Alcohol use: No    Alcohol/week: 0.0 standard drinks  . Drug use: No  . Sexual activity: Not Currently    Partners: Male    Birth control/protection: Post-menopausal  Lifestyle  . Physical activity:    Days per week: Not on file    Minutes per session: Not on file  . Stress: Not on file  Relationships  . Social connections:    Talks on phone: Not on file  Gets together: Not on file    Attends religious service: Not on file    Active member of club or organization: Not on file    Attends meetings of clubs or organizations: Not on file    Relationship status: Not on file  . Intimate partner violence:    Fear of current or ex partner: Not on file    Emotionally abused: Not on file    Physically abused: Not on file    Forced sexual activity: Not on file  Other Topics Concern  . Not on file  Social History Narrative  . Not on file    Allergies  Allergen Reactions  . Erythromycin Nausea And Vomiting    Stomach cramps     Current Outpatient Medications  Medication Sig Dispense Refill  . amitriptyline (ELAVIL) 10 MG tablet Take 1 tablet (10 mg total) by mouth at bedtime as needed for sleep. (Patient taking differently: Take 2.5 mg by mouth at bedtime as needed for sleep. ) 30 tablet 2  . Misc Natural Products (OSTEO BI-FLEX TRIPLE STRENGTH) TABS Take 1 tablet by mouth daily.    . Multiple Vitamin (MULTIVITAMIN WITH MINERALS) TABS tablet Take 1 tablet by mouth daily.    Vladimir Faster Glycol-Propyl Glycol (SYSTANE OP) Place 1 drop into both eyes 2 (two) times daily as needed (dry eyes).    Marland Kitchen rOPINIRole (REQUIP) 0.5 MG tablet Take 1 tablet (0.5 mg total) by mouth 3 (three) times daily. (Patient taking differently: Take 0.5 mg by mouth at bedtime as needed (restless leg). ) 90 tablet 0   No current facility-administered medications for this visit.     REVIEW OF SYSTEMS:  [X]  denotes positive finding, [ ]  denotes negative finding Cardiac  Comments:  Chest pain or chest pressure:    Shortness of breath upon exertion:    Short of breath when lying flat:    Irregular heart rhythm:        Vascular    Pain in calf, thigh, or hip brought on by ambulation: x   Pain in feet at night that wakes you up from your sleep:     Blood clot in your veins:    Leg swelling:         Pulmonary    Oxygen at home:    Productive cough:     Wheezing:         Neurologic    Sudden weakness in arms or legs:     Sudden numbness in arms or legs:     Sudden onset of difficulty speaking or slurred speech:    Temporary loss of vision in one eye:     Problems with dizziness:         Gastrointestinal    Blood in stool:     Vomited blood:         Genitourinary    Burning when urinating:     Blood in urine:        Psychiatric    Major depression:         Hematologic    Bleeding problems:    Problems with blood clotting too easily:        Skin    Rashes or ulcers:        Constitutional    Fever or chills:     PHYSICAL  EXAM:   Vitals:   08/20/18 1507  BP: 106/75  Pulse: 92  Resp: 16  Temp: 99.7 F (37.6 C)  TempSrc: Oral  SpO2: 99%  Weight: 131 lb (59.4 kg)  Height: 5\' 3"  (1.6 m)    GENERAL: The patient is a well-nourished female, in no acute distress. The vital signs are documented above. CARDIAC: There is a regular rate and rhythm.  VASCULAR: I do not detect carotid bruits. She has palpable femoral pulses dorsalis pedis and posterior tibial pulses bilaterally. She has no significant lower extremity swelling. PULMONARY: There is good air exchange bilaterally without wheezing or rales. ABDOMEN: Soft and non-tender with normal pitched bowel sounds.  MUSCULOSKELETAL: There are no major deformities or cyanosis. NEUROLOGIC: No focal weakness or paresthesias are detected. SKIN: There are no ulcers or rashes noted. PSYCHIATRIC: The patient has a normal affect.  DATA:    MRI LUMBAR SPINE: I have reviewed the MRI of the lumbar spine that was done on 07/04/2018.  The patient has marked right foraminal encroachment secondary to spurring at the L5-S1 level.

## 2018-08-20 NOTE — Progress Notes (Signed)
PCP - Dr. Carolann Littler Cardiologist - denies  Chest x-ray - N/A EKG - N/A Stress Test - denies ECHO - denies Cardiac Cath - denies  Sleep Study - denies  Aspirin Instructions: Patient instructed to hold all Aspirin, NSAID's, herbal medications, fish oil and vitamins 7 days prior to surgery.   Anesthesia review:   Patient denies shortness of breath, fever, cough and chest pain at PAT appointment   Patient verbalized understanding of instructions that were given to them at the PAT appointment. Patient was also instructed that they will need to review over the PAT instructions again at home before surgery.

## 2018-08-20 NOTE — H&P (View-Only) (Signed)
REASON FOR CONSULT:    To evaluate for anterior retroperitoneal exposure of L5-S1.  The consult is requested by Dr. Vertell Limber.  ASSESSMENT & PLAN:   DEGENERATIVE DISC DISEASE L5-S1: The patient appears to be a good candidate for anterior retroperitoneal exposure of L5-S1. I have reviewed our role in exposure of the spine in order to allow anterior lumbar interbody fusion at the appropriate levels. We have discussed the potential complications of surgery, including but not limited to, arterial or venous injury, thrombosis, or bleeding. We have also discussed the potential risks of wound healing problems, the development of a hernia, nerve injury, leg swelling, or other unpredictable medical problems.  All the patient's questions were answered and they are agreeable to proceed.  Her surgery is scheduled for 09/02/2018.  Deitra Mayo, MD, FACS Beeper [phone] Office: [phone]   HPI:   Dana Cook is a pleasant 58 y.o. female, who developed right buttock pain back in 2007.  She had an extensive work-up and ultimately was found to have significant degenerative disc disease of her back with scoliosis.  She has tried physical therapy, injection therapy and dry needling treatments.  She continues to have significant pain.  She was evaluated and felt to be a good candidate for anterior lumbar interbody fusion at the L5-S1 level.  We were consulted to provide anterior retroperitoneal exposure.  Her symptoms radiate down to her foot on the right side.  Her symptoms are aggravated by activity and sitting at times.  There are really no alleviating factors.  She is had no previous abdominal surgery.  She really does not have any risk factors for peripheral vascular disease.  She denies any history of diabetes, hypertension, hypercholesterolemia, family history of premature cardiovascular disease, or smoking history.  Past Medical History:  Diagnosis Date  . Abnormal Pap smear of cervix 2008   . Allergy   . Atypical glandular cells of undetermined significance (AGUS) on cervical Pap smear 2008   TWICE, D&C negative  . Chicken pox   . Chronic sinusitis   . Osteoarthritis     Family History  Problem Relation Age of Onset  . Hypertension Mother   . Heart disease Father   . Hypertension Father   . Hypertension Paternal Grandmother   . Colon cancer Neg Hx     SOCIAL HISTORY: Social History   Socioeconomic History  . Marital status: Single    Spouse name: Not on file  . Number of children: Not on file  . Years of education: Not on file  . Highest education level: Not on file  Occupational History  . Not on file  Social Needs  . Financial resource strain: Not on file  . Food insecurity:    Worry: Not on file    Inability: Not on file  . Transportation needs:    Medical: Not on file    Non-medical: Not on file  Tobacco Use  . Smoking status: Never Smoker  . Smokeless tobacco: Never Used  Substance and Sexual Activity  . Alcohol use: No    Alcohol/week: 0.0 standard drinks  . Drug use: No  . Sexual activity: Not Currently    Partners: Male    Birth control/protection: Post-menopausal  Lifestyle  . Physical activity:    Days per week: Not on file    Minutes per session: Not on file  . Stress: Not on file  Relationships  . Social connections:    Talks on phone: Not on file  Gets together: Not on file    Attends religious service: Not on file    Active member of club or organization: Not on file    Attends meetings of clubs or organizations: Not on file    Relationship status: Not on file  . Intimate partner violence:    Fear of current or ex partner: Not on file    Emotionally abused: Not on file    Physically abused: Not on file    Forced sexual activity: Not on file  Other Topics Concern  . Not on file  Social History Narrative  . Not on file    Allergies  Allergen Reactions  . Erythromycin Nausea And Vomiting    Stomach cramps     Current Outpatient Medications  Medication Sig Dispense Refill  . amitriptyline (ELAVIL) 10 MG tablet Take 1 tablet (10 mg total) by mouth at bedtime as needed for sleep. (Patient taking differently: Take 2.5 mg by mouth at bedtime as needed for sleep. ) 30 tablet 2  . Misc Natural Products (OSTEO BI-FLEX TRIPLE STRENGTH) TABS Take 1 tablet by mouth daily.    . Multiple Vitamin (MULTIVITAMIN WITH MINERALS) TABS tablet Take 1 tablet by mouth daily.    Vladimir Faster Glycol-Propyl Glycol (SYSTANE OP) Place 1 drop into both eyes 2 (two) times daily as needed (dry eyes).    Marland Kitchen rOPINIRole (REQUIP) 0.5 MG tablet Take 1 tablet (0.5 mg total) by mouth 3 (three) times daily. (Patient taking differently: Take 0.5 mg by mouth at bedtime as needed (restless leg). ) 90 tablet 0   No current facility-administered medications for this visit.     REVIEW OF SYSTEMS:  [X]  denotes positive finding, [ ]  denotes negative finding Cardiac  Comments:  Chest pain or chest pressure:    Shortness of breath upon exertion:    Short of breath when lying flat:    Irregular heart rhythm:        Vascular    Pain in calf, thigh, or hip brought on by ambulation: x   Pain in feet at night that wakes you up from your sleep:     Blood clot in your veins:    Leg swelling:         Pulmonary    Oxygen at home:    Productive cough:     Wheezing:         Neurologic    Sudden weakness in arms or legs:     Sudden numbness in arms or legs:     Sudden onset of difficulty speaking or slurred speech:    Temporary loss of vision in one eye:     Problems with dizziness:         Gastrointestinal    Blood in stool:     Vomited blood:         Genitourinary    Burning when urinating:     Blood in urine:        Psychiatric    Major depression:         Hematologic    Bleeding problems:    Problems with blood clotting too easily:        Skin    Rashes or ulcers:        Constitutional    Fever or chills:     PHYSICAL  EXAM:   Vitals:   08/20/18 1507  BP: 106/75  Pulse: 92  Resp: 16  Temp: 99.7 F (37.6 C)  TempSrc: Oral  SpO2: 99%  Weight: 131 lb (59.4 kg)  Height: 5\' 3"  (1.6 m)    GENERAL: The patient is a well-nourished female, in no acute distress. The vital signs are documented above. CARDIAC: There is a regular rate and rhythm.  VASCULAR: I do not detect carotid bruits. She has palpable femoral pulses dorsalis pedis and posterior tibial pulses bilaterally. She has no significant lower extremity swelling. PULMONARY: There is good air exchange bilaterally without wheezing or rales. ABDOMEN: Soft and non-tender with normal pitched bowel sounds.  MUSCULOSKELETAL: There are no major deformities or cyanosis. NEUROLOGIC: No focal weakness or paresthesias are detected. SKIN: There are no ulcers or rashes noted. PSYCHIATRIC: The patient has a normal affect.  DATA:    MRI LUMBAR SPINE: I have reviewed the MRI of the lumbar spine that was done on 07/04/2018.  The patient has marked right foraminal encroachment secondary to spurring at the L5-S1 level.

## 2018-09-01 ENCOUNTER — Encounter (HOSPITAL_COMMUNITY): Payer: Self-pay | Admitting: Anesthesiology

## 2018-09-01 NOTE — Anesthesia Preprocedure Evaluation (Addendum)
Anesthesia Evaluation  Patient identified by MRN, date of birth, ID band Patient awake    Reviewed: Allergy & Precautions, NPO status , Patient's Chart, lab work & pertinent test results  Airway Mallampati: I  TM Distance: >3 FB Neck ROM: Full    Dental  (+) Teeth Intact, Dental Advisory Given   Pulmonary neg pulmonary ROS,    breath sounds clear to auscultation       Cardiovascular negative cardio ROS   Rhythm:Regular Rate:Normal     Neuro/Psych negative neurological ROS     GI/Hepatic negative GI ROS, Neg liver ROS,   Endo/Other    Renal/GU negative Renal ROS     Musculoskeletal  (+) Arthritis ,   Abdominal Normal abdominal exam  (+)   Peds  Hematology negative hematology ROS (+)   Anesthesia Other Findings   Reproductive/Obstetrics                            Anesthesia Physical Anesthesia Plan  ASA: II  Anesthesia Plan: General   Post-op Pain Management:    Induction: Intravenous  PONV Risk Score and Plan: 4 or greater and Ondansetron, Dexamethasone, Midazolam and Scopolamine patch - Pre-op  Airway Management Planned: Oral ETT  Additional Equipment: Arterial line  Intra-op Plan:   Post-operative Plan: Extubation in OR  Informed Consent: I have reviewed the patients History and Physical, chart, labs and discussed the procedure including the risks, benefits and alternatives for the proposed anesthesia with the patient or authorized representative who has indicated his/her understanding and acceptance.     Dental advisory given  Plan Discussed with: CRNA  Anesthesia Plan Comments: (Possible arterial line. )      Anesthesia Quick Evaluation

## 2018-09-02 ENCOUNTER — Inpatient Hospital Stay (HOSPITAL_COMMUNITY): Payer: BC Managed Care – PPO

## 2018-09-02 ENCOUNTER — Inpatient Hospital Stay (HOSPITAL_COMMUNITY): Payer: BC Managed Care – PPO | Admitting: Anesthesiology

## 2018-09-02 ENCOUNTER — Inpatient Hospital Stay (HOSPITAL_COMMUNITY)
Admission: RE | Admit: 2018-09-02 | Discharge: 2018-09-03 | DRG: 460 | Disposition: A | Discharge status: Home Health | Payer: BC Managed Care – PPO | Attending: Neurosurgery | Admitting: Neurosurgery

## 2018-09-02 ENCOUNTER — Other Ambulatory Visit: Payer: Self-pay

## 2018-09-02 ENCOUNTER — Encounter (HOSPITAL_COMMUNITY): Payer: Self-pay | Admitting: *Deleted

## 2018-09-02 ENCOUNTER — Encounter (HOSPITAL_COMMUNITY)
Admission: RE | Disposition: A | Discharge status: Home Health | Payer: Self-pay | Source: Home / Self Care | Attending: Neurosurgery

## 2018-09-02 DIAGNOSIS — K219 Gastro-esophageal reflux disease without esophagitis: Secondary | ICD-10-CM | POA: Diagnosis present

## 2018-09-02 DIAGNOSIS — M4120 Other idiopathic scoliosis, site unspecified: Secondary | ICD-10-CM | POA: Diagnosis present

## 2018-09-02 DIAGNOSIS — M48061 Spinal stenosis, lumbar region without neurogenic claudication: Secondary | ICD-10-CM | POA: Diagnosis present

## 2018-09-02 DIAGNOSIS — Z419 Encounter for procedure for purposes other than remedying health state, unspecified: Secondary | ICD-10-CM

## 2018-09-02 DIAGNOSIS — M419 Scoliosis, unspecified: Secondary | ICD-10-CM | POA: Diagnosis present

## 2018-09-02 DIAGNOSIS — M5117 Intervertebral disc disorders with radiculopathy, lumbosacral region: Secondary | ICD-10-CM | POA: Diagnosis present

## 2018-09-02 DIAGNOSIS — M4126 Other idiopathic scoliosis, lumbar region: Secondary | ICD-10-CM

## 2018-09-02 DIAGNOSIS — M549 Dorsalgia, unspecified: Secondary | ICD-10-CM | POA: Diagnosis present

## 2018-09-02 DIAGNOSIS — Z8249 Family history of ischemic heart disease and other diseases of the circulatory system: Secondary | ICD-10-CM | POA: Diagnosis not present

## 2018-09-02 DIAGNOSIS — G2581 Restless legs syndrome: Secondary | ICD-10-CM | POA: Diagnosis present

## 2018-09-02 DIAGNOSIS — M5136 Other intervertebral disc degeneration, lumbar region: Secondary | ICD-10-CM | POA: Diagnosis not present

## 2018-09-02 DIAGNOSIS — M5116 Intervertebral disc disorders with radiculopathy, lumbar region: Principal | ICD-10-CM | POA: Diagnosis present

## 2018-09-02 DIAGNOSIS — Z881 Allergy status to other antibiotic agents status: Secondary | ICD-10-CM | POA: Diagnosis not present

## 2018-09-02 DIAGNOSIS — I10 Essential (primary) hypertension: Secondary | ICD-10-CM | POA: Diagnosis present

## 2018-09-02 HISTORY — PX: ANTERIOR LUMBAR FUSION: SHX1170

## 2018-09-02 HISTORY — PX: ABDOMINAL EXPOSURE: SHX5708

## 2018-09-02 SURGERY — ANTERIOR LUMBAR FUSION 1 LEVEL
Anesthesia: General

## 2018-09-02 MED ORDER — FENTANYL CITRATE (PF) 250 MCG/5ML IJ SOLN
INTRAMUSCULAR | Status: AC
  Filled 2018-09-02: qty 5

## 2018-09-02 MED ORDER — SODIUM CHLORIDE 0.9% FLUSH
3.0000 mL | Freq: Two times a day (BID) | INTRAVENOUS | Status: DC
  Administered 2018-09-02: 3 mL via INTRAVENOUS

## 2018-09-02 MED ORDER — OXYCODONE HCL 5 MG PO TABS
ORAL_TABLET | ORAL | Status: AC
  Filled 2018-09-02: qty 2

## 2018-09-02 MED ORDER — DEXAMETHASONE SODIUM PHOSPHATE 10 MG/ML IJ SOLN
INTRAMUSCULAR | Status: AC
  Filled 2018-09-02: qty 1

## 2018-09-02 MED ORDER — METHOCARBAMOL 500 MG PO TABS
500.0000 mg | ORAL_TABLET | Freq: Four times a day (QID) | ORAL | Status: DC | PRN
  Administered 2018-09-02 – 2018-09-03 (×2): 500 mg via ORAL
  Filled 2018-09-02: qty 1

## 2018-09-02 MED ORDER — LACTATED RINGERS IV SOLN
INTRAVENOUS | Status: DC | PRN
  Administered 2018-09-02: 07:00:00 via INTRAVENOUS

## 2018-09-02 MED ORDER — HYPROMELLOSE (GONIOSCOPIC) 2.5 % OP SOLN
Freq: Two times a day (BID) | OPHTHALMIC | Status: DC | PRN
  Filled 2018-09-02: qty 15

## 2018-09-02 MED ORDER — CEFAZOLIN SODIUM-DEXTROSE 2-4 GM/100ML-% IV SOLN
2.0000 g | Freq: Three times a day (TID) | INTRAVENOUS | Status: AC
  Administered 2018-09-03: 2 g via INTRAVENOUS
  Filled 2018-09-02: qty 100

## 2018-09-02 MED ORDER — HYDROMORPHONE HCL 1 MG/ML IJ SOLN
INTRAMUSCULAR | Status: AC
  Filled 2018-09-02: qty 1

## 2018-09-02 MED ORDER — SODIUM CHLORIDE 0.9 % IV SOLN: 250.0000 mL | INTRAVENOUS | Status: DC

## 2018-09-02 MED ORDER — DEXAMETHASONE SODIUM PHOSPHATE 10 MG/ML IJ SOLN
INTRAMUSCULAR | Status: DC | PRN
  Administered 2018-09-02: 4 mg via INTRAVENOUS

## 2018-09-02 MED ORDER — ONDANSETRON HCL 4 MG/2ML IJ SOLN
4.0000 mg | Freq: Four times a day (QID) | INTRAMUSCULAR | Status: DC | PRN
  Administered 2018-09-02: 4 mg via INTRAVENOUS
  Filled 2018-09-02: qty 2

## 2018-09-02 MED ORDER — POLYETHYLENE GLYCOL 3350 17 G PO PACK: 17.0000 g | PACK | Freq: Every day | ORAL | Status: DC | PRN

## 2018-09-02 MED ORDER — PROPOFOL 10 MG/ML IV BOLUS
INTRAVENOUS | Status: AC
  Filled 2018-09-02: qty 20

## 2018-09-02 MED ORDER — POLYVINYL ALCOHOL 1.4 % OP SOLN
2.0000 [drp] | OPHTHALMIC | Status: DC | PRN
  Filled 2018-09-02: qty 15

## 2018-09-02 MED ORDER — CHLORHEXIDINE GLUCONATE 4 % EX LIQD: 60.0000 mL | Freq: Once | CUTANEOUS | Status: DC

## 2018-09-02 MED ORDER — ONDANSETRON HCL 4 MG PO TABS: 4.0000 mg | ORAL_TABLET | Freq: Four times a day (QID) | ORAL | Status: DC | PRN

## 2018-09-02 MED ORDER — DOCUSATE SODIUM 100 MG PO CAPS
100.0000 mg | ORAL_CAPSULE | Freq: Two times a day (BID) | ORAL | Status: DC
  Administered 2018-09-02 (×2): 100 mg via ORAL
  Filled 2018-09-02 (×3): qty 1

## 2018-09-02 MED ORDER — CEFAZOLIN SODIUM-DEXTROSE 2-4 GM/100ML-% IV SOLN
2.0000 g | INTRAVENOUS | Status: AC
  Administered 2018-09-02: 2 g via INTRAVENOUS
  Filled 2018-09-02: qty 100

## 2018-09-02 MED ORDER — THROMBIN 5000 UNITS EX SOLR
CUTANEOUS | Status: AC
  Filled 2018-09-02: qty 5000

## 2018-09-02 MED ORDER — KCL IN DEXTROSE-NACL 20-5-0.45 MEQ/L-%-% IV SOLN
INTRAVENOUS | Status: DC
  Administered 2018-09-02: 20:00:00 via INTRAVENOUS
  Filled 2018-09-02: qty 1000

## 2018-09-02 MED ORDER — ACETAMINOPHEN 650 MG RE SUPP: 650.0000 mg | RECTAL | Status: DC | PRN

## 2018-09-02 MED ORDER — BUPIVACAINE HCL (PF) 0.5 % IJ SOLN
INTRAMUSCULAR | Status: AC
  Filled 2018-09-02: qty 30

## 2018-09-02 MED ORDER — ACETAMINOPHEN 160 MG/5ML PO SOLN: 325.0000 mg | Freq: Once | ORAL | Status: DC

## 2018-09-02 MED ORDER — CHLORHEXIDINE GLUCONATE CLOTH 2 % EX PADS: 6.0000 | MEDICATED_PAD | Freq: Once | CUTANEOUS | Status: DC

## 2018-09-02 MED ORDER — ROCURONIUM BROMIDE 50 MG/5ML IV SOSY
PREFILLED_SYRINGE | INTRAVENOUS | Status: AC
  Filled 2018-09-02: qty 5

## 2018-09-02 MED ORDER — SUCCINYLCHOLINE CHLORIDE 200 MG/10ML IV SOSY
PREFILLED_SYRINGE | INTRAVENOUS | Status: DC | PRN
  Administered 2018-09-02: 100 mg via INTRAVENOUS

## 2018-09-02 MED ORDER — ACETAMINOPHEN 325 MG PO TABS: 325.0000 mg | ORAL_TABLET | Freq: Once | ORAL | Status: DC

## 2018-09-02 MED ORDER — HYDROMORPHONE HCL 1 MG/ML IJ SOLN
0.2500 mg | INTRAMUSCULAR | Status: DC | PRN
  Administered 2018-09-02: 0.25 mg via INTRAVENOUS

## 2018-09-02 MED ORDER — OXYCODONE HCL 5 MG PO TABS
10.0000 mg | ORAL_TABLET | ORAL | Status: DC | PRN
  Administered 2018-09-02 (×2): 10 mg via ORAL
  Filled 2018-09-02: qty 2

## 2018-09-02 MED ORDER — LACTATED RINGERS IV SOLN: INTRAVENOUS | Status: DC

## 2018-09-02 MED ORDER — ROCURONIUM BROMIDE 10 MG/ML (PF) SYRINGE
PREFILLED_SYRINGE | INTRAVENOUS | Status: DC | PRN
  Administered 2018-09-02: 20 mg via INTRAVENOUS
  Administered 2018-09-02: 50 mg via INTRAVENOUS

## 2018-09-02 MED ORDER — 0.9 % SODIUM CHLORIDE (POUR BTL) OPTIME
TOPICAL | Status: DC | PRN
  Administered 2018-09-02: 1000 mL

## 2018-09-02 MED ORDER — LIDOCAINE 2% (20 MG/ML) 5 ML SYRINGE
INTRAMUSCULAR | Status: DC | PRN
  Administered 2018-09-02: 60 mg via INTRAVENOUS

## 2018-09-02 MED ORDER — ACETAMINOPHEN 10 MG/ML IV SOLN: 1000.0000 mg | Freq: Once | INTRAVENOUS | Status: DC | PRN

## 2018-09-02 MED ORDER — ONDANSETRON HCL 4 MG/2ML IJ SOLN
INTRAMUSCULAR | Status: AC
  Filled 2018-09-02: qty 2

## 2018-09-02 MED ORDER — LIDOCAINE 2% (20 MG/ML) 5 ML SYRINGE
INTRAMUSCULAR | Status: AC
  Filled 2018-09-02: qty 5

## 2018-09-02 MED ORDER — PHENOL 1.4 % MT LIQD: 1.0000 | OROMUCOSAL | Status: DC | PRN

## 2018-09-02 MED ORDER — ADULT MULTIVITAMIN W/MINERALS CH
1.0000 | ORAL_TABLET | Freq: Every day | ORAL | Status: DC
  Administered 2018-09-02 – 2018-09-03 (×2): 1 via ORAL
  Filled 2018-09-02 (×2): qty 1

## 2018-09-02 MED ORDER — ACETAMINOPHEN 325 MG PO TABS
650.0000 mg | ORAL_TABLET | ORAL | Status: DC | PRN
  Administered 2018-09-03: 650 mg via ORAL
  Filled 2018-09-02: qty 2

## 2018-09-02 MED ORDER — ONDANSETRON HCL 4 MG/2ML IJ SOLN
INTRAMUSCULAR | Status: DC | PRN
  Administered 2018-09-02: 4 mg via INTRAVENOUS

## 2018-09-02 MED ORDER — PANTOPRAZOLE SODIUM 40 MG IV SOLR
40.0000 mg | Freq: Every day | INTRAVENOUS | Status: DC
  Administered 2018-09-02: 40 mg via INTRAVENOUS
  Filled 2018-09-02: qty 40

## 2018-09-02 MED ORDER — MEPERIDINE HCL 50 MG/ML IJ SOLN: 6.2500 mg | INTRAMUSCULAR | Status: DC | PRN

## 2018-09-02 MED ORDER — MORPHINE SULFATE (PF) 2 MG/ML IV SOLN: 2.0000 mg | INTRAVENOUS | Status: DC | PRN

## 2018-09-02 MED ORDER — THROMBIN 5000 UNITS EX SOLR
OROMUCOSAL | Status: DC | PRN
  Administered 2018-09-02: 08:00:00 via TOPICAL

## 2018-09-02 MED ORDER — PROMETHAZINE HCL 25 MG/ML IJ SOLN: 6.2500 mg | INTRAMUSCULAR | Status: DC | PRN

## 2018-09-02 MED ORDER — SUCCINYLCHOLINE CHLORIDE 200 MG/10ML IV SOSY
PREFILLED_SYRINGE | INTRAVENOUS | Status: AC
  Filled 2018-09-02: qty 10

## 2018-09-02 MED ORDER — MENTHOL 3 MG MT LOZG: 1.0000 | LOZENGE | OROMUCOSAL | Status: DC | PRN

## 2018-09-02 MED ORDER — MIDAZOLAM HCL 2 MG/2ML IJ SOLN
INTRAMUSCULAR | Status: AC
  Filled 2018-09-02: qty 2

## 2018-09-02 MED ORDER — ALUM & MAG HYDROXIDE-SIMETH 200-200-20 MG/5ML PO SUSP: 30.0000 mL | Freq: Four times a day (QID) | ORAL | Status: DC | PRN

## 2018-09-02 MED ORDER — ROPINIROLE HCL 1 MG PO TABS: 0.5000 mg | ORAL_TABLET | Freq: Every evening | ORAL | Status: DC | PRN

## 2018-09-02 MED ORDER — SODIUM CHLORIDE 0.9 % IV SOLN
INTRAVENOUS | Status: DC | PRN
  Administered 2018-09-02: 25 ug/min via INTRAVENOUS

## 2018-09-02 MED ORDER — SCOPOLAMINE 1 MG/3DAYS TD PT72
MEDICATED_PATCH | TRANSDERMAL | Status: AC
  Filled 2018-09-02: qty 1

## 2018-09-02 MED ORDER — PHENYLEPHRINE 40 MCG/ML (10ML) SYRINGE FOR IV PUSH (FOR BLOOD PRESSURE SUPPORT)
PREFILLED_SYRINGE | INTRAVENOUS | Status: AC
  Filled 2018-09-02: qty 10

## 2018-09-02 MED ORDER — HYDROCODONE-ACETAMINOPHEN 5-325 MG PO TABS: 1.0000 | ORAL_TABLET | ORAL | Status: DC | PRN

## 2018-09-02 MED ORDER — AMITRIPTYLINE HCL 10 MG PO TABS
5.0000 mg | ORAL_TABLET | Freq: Every evening | ORAL | Status: DC | PRN
  Filled 2018-09-02: qty 0.5

## 2018-09-02 MED ORDER — SCOPOLAMINE 1 MG/3DAYS TD PT72
MEDICATED_PATCH | TRANSDERMAL | Status: DC | PRN
  Administered 2018-09-02: 1 via TRANSDERMAL

## 2018-09-02 MED ORDER — PROPOFOL 10 MG/ML IV BOLUS
INTRAVENOUS | Status: DC | PRN
  Administered 2018-09-02: 120 mg via INTRAVENOUS

## 2018-09-02 MED ORDER — METHOCARBAMOL 1000 MG/10ML IJ SOLN
500.0000 mg | Freq: Four times a day (QID) | INTRAVENOUS | Status: DC | PRN
  Filled 2018-09-02: qty 5

## 2018-09-02 MED ORDER — OSTEO BI-FLEX TRIPLE STRENGTH PO TABS: 1.0000 | ORAL_TABLET | Freq: Every day | ORAL | Status: DC

## 2018-09-02 MED ORDER — BISACODYL 10 MG RE SUPP: 10.0000 mg | Freq: Every day | RECTAL | Status: DC | PRN

## 2018-09-02 MED ORDER — SUGAMMADEX SODIUM 200 MG/2ML IV SOLN
INTRAVENOUS | Status: DC | PRN
  Administered 2018-09-02: 120 mg via INTRAVENOUS

## 2018-09-02 MED ORDER — LIDOCAINE-EPINEPHRINE 1 %-1:100000 IJ SOLN
INTRAMUSCULAR | Status: AC
  Filled 2018-09-02: qty 1

## 2018-09-02 MED ORDER — CEFAZOLIN SODIUM-DEXTROSE 2-4 GM/100ML-% IV SOLN
2.0000 g | Freq: Three times a day (TID) | INTRAVENOUS | Status: AC
  Administered 2018-09-02: 2 g via INTRAVENOUS
  Filled 2018-09-02 (×2): qty 100

## 2018-09-02 MED ORDER — FLEET ENEMA 7-19 GM/118ML RE ENEM: 1.0000 | ENEMA | Freq: Once | RECTAL | Status: DC | PRN

## 2018-09-02 MED ORDER — SODIUM CHLORIDE 0.9% FLUSH
3.0000 mL | INTRAVENOUS | Status: DC | PRN
  Administered 2018-09-03: 3 mL via INTRAVENOUS
  Filled 2018-09-02: qty 3

## 2018-09-02 MED ORDER — PHENYLEPHRINE 40 MCG/ML (10ML) SYRINGE FOR IV PUSH (FOR BLOOD PRESSURE SUPPORT)
PREFILLED_SYRINGE | INTRAVENOUS | Status: DC | PRN
  Administered 2018-09-02 (×2): 40 ug via INTRAVENOUS

## 2018-09-02 MED ORDER — FENTANYL CITRATE (PF) 250 MCG/5ML IJ SOLN
INTRAMUSCULAR | Status: DC | PRN
  Administered 2018-09-02 (×7): 50 ug via INTRAVENOUS

## 2018-09-02 MED ORDER — METHOCARBAMOL 500 MG PO TABS
ORAL_TABLET | ORAL | Status: AC
  Filled 2018-09-02: qty 1

## 2018-09-02 MED ORDER — MIDAZOLAM HCL 5 MG/5ML IJ SOLN
INTRAMUSCULAR | Status: DC | PRN
  Administered 2018-09-02: 2 mg via INTRAVENOUS

## 2018-09-02 SURGICAL SUPPLY — 86 items
APPLIER CLIP 11 MED OPEN (CLIP) ×4
BASE TI BOLT 5.0X22.5 VARIABLE (Bolt) ×3 IMPLANT
BASE TI IMPLANT 10X42X30 15D (Neurosurgery Supplies) ×1 IMPLANT
BASKET BONE COLLECTION (BASKET) IMPLANT
BUR BARREL STRAIGHT FLUTE 4.0 (BURR) IMPLANT
CANISTER SUCT 3000ML PPV (MISCELLANEOUS) ×2 IMPLANT
CLIP APPLIE 11 MED OPEN (CLIP) ×2 IMPLANT
COVER BACK TABLE 60X90IN (DRAPES) ×4 IMPLANT
COVER WAND RF STERILE (DRAPES) ×3 IMPLANT
DECANTER SPIKE VIAL GLASS SM (MISCELLANEOUS) ×2 IMPLANT
DERMABOND ADVANCED (GAUZE/BANDAGES/DRESSINGS) ×1
DERMABOND ADVANCED .7 DNX12 (GAUZE/BANDAGES/DRESSINGS) ×2 IMPLANT
DRAPE C-ARM 42X72 X-RAY (DRAPES) ×2 IMPLANT
DRAPE C-ARMOR (DRAPES) ×2 IMPLANT
DRAPE INCISE IOBAN 66X45 STRL (DRAPES) ×2 IMPLANT
DRAPE LAPAROTOMY 100X72X124 (DRAPES) ×2 IMPLANT
DRSG OPSITE POSTOP 4X8 (GAUZE/BANDAGES/DRESSINGS) ×1 IMPLANT
DURAPREP 26ML APPLICATOR (WOUND CARE) ×2 IMPLANT
ELECT BLADE 4.0 EZ CLEAN MEGAD (MISCELLANEOUS) ×2
ELECT REM PT RETURN 9FT ADLT (ELECTROSURGICAL) ×2
ELECTRODE BLDE 4.0 EZ CLN MEGD (MISCELLANEOUS) IMPLANT
ELECTRODE REM PT RTRN 9FT ADLT (ELECTROSURGICAL) ×1 IMPLANT
GAUZE 4X4 16PLY RFD (DISPOSABLE) IMPLANT
GAUZE SPONGE 4X4 12PLY STRL (GAUZE/BANDAGES/DRESSINGS) IMPLANT
GLOVE BIO SURGEON STRL SZ8 (GLOVE) ×2 IMPLANT
GLOVE BIOGEL PI IND STRL 8 (GLOVE) ×2 IMPLANT
GLOVE BIOGEL PI IND STRL 8.5 (GLOVE) ×1 IMPLANT
GLOVE BIOGEL PI INDICATOR 8 (GLOVE) ×2
GLOVE BIOGEL PI INDICATOR 8.5 (GLOVE) ×1
GLOVE ECLIPSE 7.5 STRL STRAW (GLOVE) ×6 IMPLANT
GLOVE ECLIPSE 8.0 STRL XLNG CF (GLOVE) ×2 IMPLANT
GLOVE EXAM NITRILE XL STR (GLOVE) IMPLANT
GOWN STRL REUS W/ TWL LRG LVL3 (GOWN DISPOSABLE) ×3 IMPLANT
GOWN STRL REUS W/ TWL XL LVL3 (GOWN DISPOSABLE) ×1 IMPLANT
GOWN STRL REUS W/TWL 2XL LVL3 (GOWN DISPOSABLE) ×3 IMPLANT
GOWN STRL REUS W/TWL LRG LVL3 (GOWN DISPOSABLE) ×1
GOWN STRL REUS W/TWL XL LVL3 (GOWN DISPOSABLE) ×2
INSERT FOGARTY 61MM (MISCELLANEOUS) IMPLANT
INSERT FOGARTY SM (MISCELLANEOUS) IMPLANT
KIT BASIN OR (CUSTOM PROCEDURE TRAY) ×2 IMPLANT
KIT INFUSE XX SMALL 0.7CC (Orthopedic Implant) ×1 IMPLANT
KIT TURNOVER KIT B (KITS) ×4 IMPLANT
LOOP VESSEL MAXI BLUE (MISCELLANEOUS) IMPLANT
LOOP VESSEL MINI RED (MISCELLANEOUS) IMPLANT
NDL HYPO 25X1 1.5 SAFETY (NEEDLE) IMPLANT
NDL SPNL 18GX3.5 QUINCKE PK (NEEDLE) ×1 IMPLANT
NEEDLE HYPO 25X1 1.5 SAFETY (NEEDLE) IMPLANT
NEEDLE SPNL 18GX3.5 QUINCKE PK (NEEDLE) ×2 IMPLANT
NS IRRIG 1000ML POUR BTL (IV SOLUTION) ×2 IMPLANT
PACK LAMINECTOMY NEURO (CUSTOM PROCEDURE TRAY) ×2 IMPLANT
PAD ARMBOARD 7.5X6 YLW CONV (MISCELLANEOUS) ×4 IMPLANT
PUTTY BONE ATTRAX 10CC STRIP (Putty) ×1 IMPLANT
SPONGE INTESTINAL PEANUT (DISPOSABLE) ×3 IMPLANT
SPONGE LAP 18X18 RF (DISPOSABLE) ×2 IMPLANT
SPONGE LAP 18X36 RFD (DISPOSABLE) ×1 IMPLANT
SPONGE LAP 4X18 RFD (DISPOSABLE) IMPLANT
SPONGE SURGIFOAM ABS GEL SZ50 (HEMOSTASIS) ×2 IMPLANT
STAPLER VISISTAT (STAPLE) IMPLANT
STAPLER VISISTAT 35W (STAPLE) IMPLANT
SUT PDS AB 1 CTX 36 (SUTURE) ×2 IMPLANT
SUT PROLENE 4 0 RB 1 (SUTURE)
SUT PROLENE 4-0 RB1 .5 CRCL 36 (SUTURE) IMPLANT
SUT PROLENE 5 0 CC1 (SUTURE) IMPLANT
SUT PROLENE 6 0 C 1 30 (SUTURE) ×2 IMPLANT
SUT PROLENE 6 0 CC (SUTURE) IMPLANT
SUT SILK 0 TIES 10X30 (SUTURE) ×2 IMPLANT
SUT SILK 2 0 TIES 10X30 (SUTURE) ×2 IMPLANT
SUT SILK 2 0 TIES 17X18 (SUTURE) ×1
SUT SILK 2 0SH CR/8 30 (SUTURE) IMPLANT
SUT SILK 2-0 18XBRD TIE BLK (SUTURE) ×1 IMPLANT
SUT SILK 3 0 TIES 10X30 (SUTURE) ×2 IMPLANT
SUT SILK 3 0SH CR/8 30 (SUTURE) IMPLANT
SUT VIC AB 0 CT1 27 (SUTURE) ×1
SUT VIC AB 0 CT1 27XBRD ANBCTR (SUTURE) ×1 IMPLANT
SUT VIC AB 1 CT1 18XBRD ANBCTR (SUTURE) IMPLANT
SUT VIC AB 1 CT1 8-18 (SUTURE)
SUT VIC AB 2-0 CT1 36 (SUTURE) ×3 IMPLANT
SUT VIC AB 2-0 CTB1 (SUTURE) ×2 IMPLANT
SUT VIC AB 2-0 CTX 36 (SUTURE) ×1 IMPLANT
SUT VIC AB 3-0 SH 27 (SUTURE) ×3
SUT VIC AB 3-0 SH 27X BRD (SUTURE) ×2 IMPLANT
SUT VICRYL 4-0 PS2 18IN ABS (SUTURE) IMPLANT
TOWEL GREEN STERILE (TOWEL DISPOSABLE) ×4 IMPLANT
TOWEL GREEN STERILE FF (TOWEL DISPOSABLE) ×2 IMPLANT
TRAY FOLEY MTR SLVR 16FR STAT (SET/KITS/TRAYS/PACK) ×2 IMPLANT
WATER STERILE IRR 1000ML POUR (IV SOLUTION) ×2 IMPLANT

## 2018-09-02 NOTE — Op Note (Signed)
09/02/2018  10:03 AM  PATIENT:  Dana Cook  58 y.o. female  PRE-OPERATIVE DIAGNOSIS:  Lumbar foraminal stenosis, lumbar scoliosis, herniated lumbar disc, lumbago, radiculopathy   POST-OPERATIVE DIAGNOSIS:  Lumbar foraminal stenosis, lumbar scoliosis, herniated lumbar disc, lumbago, radiculopathy  PROCEDURE:  Procedure(s): Lumbar five Sacral one  Anterior lumbar interbody fusion (N/A) ABDOMINAL EXPOSURE (N/A) Titanium Cage and screws   SURGEON:  Surgeon(s) and Role: Panel 1:    Erline Levine, MD - Primary Panel 2:    * Serafina Mitchell, MD - Primary  PHYSICIAN ASSISTANT:   ASSISTANTS: Poteat, RN   ANESTHESIA:   general  EBL:  50 mL   BLOOD ADMINISTERED:none  DRAINS: none   LOCAL MEDICATIONS USED:  MARCAINE    and LIDOCAINE   SPECIMEN:  No Specimen  DISPOSITION OF SPECIMEN:  N/A  COUNTS:  YES  TOURNIQUET:  * No tourniquets in log *  INDICATIONS:  [name] is 58 year old female with chronic and intractable back and right lower extremity pain with progressive weakness.  She has a foraminal disc herniation and is developing progressive right leg weakness in an L 5 distribution.  She has severe scoliosis with marked disc degeneration and spondylosis at the L 5 S 1 level.    It was elected to take her to surgery for anterior lumbar decompression and fusion at the L 5 S1 level.  PROCEDURE:  Doctor Trula Slade performed exposure and his portion of the procedure will be dictated separately.  Upon exposing the L 5 S1 level, a localizing X ray was obtained with the C arm.  I then incised the anterior annulus and performed a thorough discectomy.  The endplates were cleared of disc and cartilagenous material and a thorough discectomy was performed with decompression of the ventral annulus and disc material.  After trial, a 15 degree lordotic x 10 x 42 x 30 mm Base Nuvasive titanium cage implant was selected, packed with extra extra small BMP and Attrax.  The implant was tamped into  position and positioning was confirmed with C arm.  Interfixated screws were placed (5.0 x 22.5 mm) were placed, two at S 1 and one at L 5.  Locking mechanisms were engaged, soft tissues were inspected and found to be in good repair.  Retractors were removed.  Fascia was closed with 1 PDS running stitch, skin edges closed with 2-0 and 3-0 vicryl sutures.  Wound was dressed with a sterile occlusive dressing.  Patient was extubated in the OR and taken to recovery having tolerated her surgery well.  Counts were correct.   PLAN OF CARE: Admit to inpatient   PATIENT DISPOSITION:  PACU - hemodynamically stable.   Delay start of Pharmacological VTE agent (>24hrs) due to surgical blood loss or risk of bleeding: yes

## 2018-09-02 NOTE — H&P (Signed)
  Patient ID:   [id number] Patient: Dana Cook  Date of Birth: 1960/09/15 Visit Type: Chart Update   Date: 08/31/2018 02:25 PM Provider: Marchia Meiers. Vertell Limber MD   This 58 year old female presents for Weakness.  HISTORY OF PRESENT ILLNESS:  1.  Weakness  I called and spoke with the patient and explained that the concerns regarding corona virus and whether we would proceed with surgeries on an elective basis.  I had already spoken with her 3 days prior and she said that she was getting worse and developing more weakness in her right leg.  On today's conversation she says that she has been unable to stand for any length of time and that her leg has been giving way and she has been falling.  She says the pain has been excruciating and she has been unable to sleep or rest.  She did not sleep at all on Friday night and says that she laid around all day today to get some relief.  She also indicates that she is having less control of her foot.  In light of these complaints, I do believe that this surgery is of greater urgency than purely elective and have therefore recommended that we proceed with surgical intervention.  I think the patient is at real risk for developing permanent weakness or progressive neurologic deficit without proceeding with surgery.  It is also concerning to me that it is unclear what length of time will be required to put the patient on hold before proceeding with surgery and given the patient's current position I think it unwise to delay.           MEDICATIONS: (added, continued or stopped this visit) Started Medication Directions Instruction Stopped   amitriptyline 10 mg tablet take 1 tablet by oral route  every bedtime     ropinirole 0.5 mg tablet take 1 tablet by oral route  every bedtime       ALLERGIES: Ingredient Reaction Medication Name [place]                       Provider:  Marchia Meiers. Vertell Limber MD  08/31/2018 02:38  PM    Dictation edited by: Marchia Meiers. Vertell Limber         Electronically signed by Marchia Meiers Vertell Limber MD on 08/31/2018 02:38 PM

## 2018-09-02 NOTE — Anesthesia Procedure Notes (Signed)
Arterial Line Insertion Start/End3/24/2020 6:55 AM, 09/02/2018 7:00 AM Performed by: Wilburn Cornelia, CRNA, CRNA  Patient location: Pre-op. Preanesthetic checklist: patient identified, IV checked, site marked, risks and benefits discussed, surgical consent, monitors and equipment checked, pre-op evaluation, timeout performed and anesthesia consent Lidocaine 1% used for infiltration and patient sedated Left, radial was placed Catheter size: 20 G Hand hygiene performed  and maximum sterile barriers used  Allen's test indicative of satisfactory collateral circulation Attempts: 1 Procedure performed without using ultrasound guided technique. Following insertion, Biopatch and dressing applied. Post procedure assessment: normal

## 2018-09-02 NOTE — Interval H&P Note (Signed)
History and Physical Interval Note:  09/02/2018 7:14 AM  Dana Cook  has presented today for surgery, with the diagnosis of Lumbar foraminal stenosis.  The various methods of treatment have been discussed with the patient and family. After consideration of risks, benefits and other options for treatment, the patient has consented to  Procedure(s) with comments: Lumbar 5 Sacral 1 Anterior lumbar interbody fusion (N/A) - Lumbar 5 Sacral 1 Anterior lumbar interbody fusion ABDOMINAL EXPOSURE (N/A) as a surgical intervention.  The patient's history has been reviewed, patient examined, no change in status, stable for surgery.  I have reviewed the patient's chart and labs.  Questions were answered to the patient's satisfaction.     Annamarie Major  Discussed change in surgeon for exposure. Went over details of surgery and risks and benefits.  All quesitions answered,.    Annamarie Major

## 2018-09-02 NOTE — Interval H&P Note (Signed)
History and Physical Interval Note:  09/02/2018 7:32 AM  Dana Cook  has presented today for surgery, with the diagnosis of Lumbar foraminal stenosis.  The various methods of treatment have been discussed with the patient and family. After consideration of risks, benefits and other options for treatment, the patient has consented to  Procedure(s) with comments: Lumbar 5 Sacral 1 Anterior lumbar interbody fusion (N/A) - Lumbar 5 Sacral 1 Anterior lumbar interbody fusion ABDOMINAL EXPOSURE (N/A) as a surgical intervention.  The patient's history has been reviewed, patient examined, no change in status, stable for surgery.  I have reviewed the patient's chart and labs.  Questions were answered to the patient's satisfaction.     Peggyann Shoals

## 2018-09-02 NOTE — Anesthesia Procedure Notes (Signed)
Procedure Name: Intubation Date/Time: 09/02/2018 7:28 AM Performed by: Wilburn Cornelia, CRNA Pre-anesthesia Checklist: Emergency Drugs available, Patient identified, Suction available, Patient being monitored and Timeout performed Patient Re-evaluated:Patient Re-evaluated prior to induction Oxygen Delivery Method: Circle system utilized Preoxygenation: Pre-oxygenation with 100% oxygen Induction Type: IV induction and Rapid sequence Laryngoscope Size: Mac and 3 Grade View: Grade I Tube type: Oral Tube size: 7.0 mm Number of attempts: 1 Airway Equipment and Method: Stylet Placement Confirmation: ETT inserted through vocal cords under direct vision,  positive ETCO2,  CO2 detector and breath sounds checked- equal and bilateral Secured at: 21 cm Tube secured with: Tape Dental Injury: Teeth and Oropharynx as per pre-operative assessment

## 2018-09-02 NOTE — Transfer of Care (Signed)
Immediate Anesthesia Transfer of Care Note  Patient: Dana Cook  Procedure(s) Performed: Lumbar five Sacral one  Anterior lumbar interbody fusion (N/A ) ABDOMINAL EXPOSURE (N/A )  Patient Location: PACU  Anesthesia Type:General  Level of Consciousness: awake, alert  and oriented  Airway & Oxygen Therapy: Patient Spontanous Breathing and Patient connected to nasal cannula oxygen  Post-op Assessment: Report given to RN and Post -op Vital signs reviewed and stable  Post vital signs: Reviewed and stable  Last Vitals:  Vitals Value Taken Time  BP 123/87 09/02/2018 10:01 AM  Temp    Pulse 84 09/02/2018 10:02 AM  Resp 13 09/02/2018 10:02 AM  SpO2 100 % 09/02/2018 10:02 AM  Vitals shown include unvalidated device data.  Last Pain:  Vitals:   09/02/18 0601  TempSrc:   PainSc: 3       Patients Stated Pain Goal: 4 (00/37/04 8889)  Complications: No apparent anesthesia complications

## 2018-09-02 NOTE — Op Note (Signed)
    Patient name: BENIGNA DELISI MRN: [id number] DOB: 11-Feb-1961 Sex: female  09/02/2018 Pre-operative Diagnosis: L5-S1 degenerative back disease Post-operative diagnosis:  Same Surgeon:  Annamarie Major Co-surgeon: Dr. Vertell Limber Procedure:   Anterior exposure L5-S1 Anesthesia: General Blood Loss: Minimal Specimens: None  Findings: Normal anatomy  Indications: The patient is having progressive symptoms secondary to her degenerative disc disease.  Anterior exposure has been recommended.  I discussed the risk benefits of my portion of the procedure with her in the holding area and she wished to proceed.  Procedure:  The patient was identified in the holding area and taken to [place] 20  The patient was then placed supine on the table. general anesthesia was administered.  The patient was prepped and draped in the usual sterile fashion.  A time out was called and antibiotics were administered.  Fluoroscopy was used to confirm the appropriate level of the skin incision.  A transverse left lower quadrant incision was made with a 10 blade.  Cautery was used about subcutaneous tissue down to the abdominal wall fascia which was then opened with cautery from the midline out to the lateral border of the rectus muscle.  Subfascial flaps were then raised.  Next using blunt dissection, I entered the retroperitoneal space laterally.  I then mobilized the abdominal contents superiorly and medially along the anterior surface of the iliac artery.  Wiley retractors were used to aid with exposure.  I then identified the ureter and mobilized it and protected it laterally.  At this point I encountered the L5-S1 disc space.  Using blunt dissection as well as cautery, it was mobilized on either side.  At this point the retractor was brought onto the field.  The O-ring was placed over the incision.  I placed a 120 blade on either side of the spine at the level of the disc space.  Additional retractors were placed superiorly  and inferiorly.  At this point I ended up ligating the median sacral vessels between silk ties.  Dr. Vertell Limber came in at this portion of the procedure, confirmed that we are at the successful this space and took over the remaining portion of the procedure.  Please see his detailed operative note for operative details.   Disposition: To PACU stable   V. Annamarie Major, M.D., [facility] [place] Office: [phone] Pager:  [phone]

## 2018-09-02 NOTE — Brief Op Note (Signed)
09/02/2018  10:03 AM  PATIENT:  Dana Cook  58 y.o. female  PRE-OPERATIVE DIAGNOSIS:  Lumbar foraminal stenosis, lumbar scoliosis, herniated lumbar disc, lumbago, radiculopathy   POST-OPERATIVE DIAGNOSIS:  Lumbar foraminal stenosis, lumbar scoliosis, herniated lumbar disc, lumbago, radiculopathy  PROCEDURE:  Procedure(s): Lumbar five Sacral one  Anterior lumbar interbody fusion (N/A) ABDOMINAL EXPOSURE (N/A) Titanium Cage and screws   SURGEON:  Surgeon(s) and Role: Panel 1:    Erline Levine, MD - Primary Panel 2:    * Serafina Mitchell, MD - Primary  PHYSICIAN ASSISTANT:   ASSISTANTS: Poteat, RN   ANESTHESIA:   general  EBL:  50 mL   BLOOD ADMINISTERED:none  DRAINS: none   LOCAL MEDICATIONS USED:  MARCAINE    and LIDOCAINE   SPECIMEN:  No Specimen  DISPOSITION OF SPECIMEN:  N/A  COUNTS:  YES  TOURNIQUET:  * No tourniquets in log *  INDICATIONS:  [name] is 58 year old female with chronic and intractable back and right lower extremity pain with progressive weakness.  She has a foraminal disc herniation and is developing progressive right leg weakness in an L 5 distribution.  She has severe scoliosis with marked disc degeneration and spondylosis at the L 5 S 1 level.    It was elected to take her to surgery for anterior lumbar decompression and fusion at the L 5 S1 level.  PROCEDURE:  Doctor Trula Slade performed exposure and his portion of the procedure will be dictated separately.  Upon exposing the L 5 S1 level, a localizing X ray was obtained with the C arm.  I then incised the anterior annulus and performed a thorough discectomy.  The endplates were cleared of disc and cartilagenous material and a thorough discectomy was performed with decompression of the ventral annulus and disc material.  After trial, a 15 degree lordotic x 10 x 42 x 30 mm Base Nuvasive titanium cage implant was selected, packed with extra extra small BMP and Attrax.  The implant was tamped into  position and positioning was confirmed with C arm.  Interfixated screws were placed (5.0 x 22.5 mm) were placed, two at S 1 and one at L 5.  Locking mechanisms were engaged, soft tissues were inspected and found to be in good repair.  Retractors were removed.  Fascia was closed with 1 PDS running stitch, skin edges closed with 2-0 and 3-0 vicryl sutures.  Wound was dressed with a sterile occlusive dressing.  Patient was extubated in the OR and taken to recovery having tolerated her surgery well.  Counts were correct.   PLAN OF CARE: Admit to inpatient   PATIENT DISPOSITION:  PACU - hemodynamically stable.   Delay start of Pharmacological VTE agent (>24hrs) due to surgical blood loss or risk of bleeding: yes

## 2018-09-02 NOTE — Anesthesia Postprocedure Evaluation (Signed)
Anesthesia Post Note  Patient: Dana Cook  Procedure(s) Performed: Lumbar five Sacral one  Anterior lumbar interbody fusion (N/A ) ABDOMINAL EXPOSURE (N/A )     Patient location during evaluation: PACU Anesthesia Type: General Level of consciousness: awake and alert Pain management: pain level controlled Vital Signs Assessment: post-procedure vital signs reviewed and stable Respiratory status: spontaneous breathing, nonlabored ventilation, respiratory function stable and patient connected to nasal cannula oxygen Cardiovascular status: blood pressure returned to baseline and stable Postop Assessment: no apparent nausea or vomiting Anesthetic complications: no    Last Vitals:  Vitals:   09/02/18 1115 09/02/18 1158  BP: 99/75 102/75  Pulse: 72 78  Resp: 15 16  Temp: (!) 36.3 C 36.8 C  SpO2: 100% 100%    Last Pain:  Vitals:   09/02/18 1419  TempSrc:   PainSc: 0-No pain                 Effie Berkshire

## 2018-09-02 NOTE — Progress Notes (Signed)
Awake, alert, conversant.  MAEW with improved leg strength.  Leg numbness and pain improved.  Patient is doing well.

## 2018-09-02 NOTE — Progress Notes (Signed)
Pt OOB and ambulated to BR to void x1; ambulated 274ft in hallways with RN supervision using walker and brace on. Pt denies any discomfort; numbness or tingling. Pt in bed with call light within reach. Will continue to closely monitor. Delia Heady RN

## 2018-09-02 NOTE — Evaluation (Signed)
Physical Therapy Evaluation Patient Details Name: Dana Cook MRN: [id number] DOB: 1960-10-05 Today's Date: 09/02/2018   History of Present Illness  Pt presents for ALIF L5-S1 due to pain and radicular symptoms down RLE. PMH: OA  Clinical Impression  Patient is s/p above surgery resulting in functional limitations due to the deficits listed below (see PT Problem List). Pt moving well, ambulated 12' with RW and min-guard A. Reviewed back precautions and proper posture and activity modification. Patient will benefit from skilled PT to increase their independence and safety with mobility to allow discharge to the venue listed below.       Follow Up Recommendations Home health PT    Equipment Recommendations  Rolling walker with 5" wheels    Recommendations for Other Services       Precautions / Restrictions Precautions Precautions: Back Precaution Booklet Issued: Yes (comment) Precaution Comments: reviewed precautions and proper posture Required Braces or Orthoses: Spinal Brace Spinal Brace: Lumbar corset;Applied in sitting position Restrictions Weight Bearing Restrictions: No      Mobility  Bed Mobility Overal bed mobility: Needs Assistance Bed Mobility: Rolling;Sidelying to Sit;Sit to Sidelying Rolling: Supervision Sidelying to sit: Supervision     Sit to sidelying: Supervision General bed mobility comments: pt familiar with log rolling from PT before surgery. Needed vc's for SL to sit to prevent twisting  Transfers Overall transfer level: Needs assistance Equipment used: Rolling walker (2 wheeled) Transfers: Sit to/from Stand Sit to Stand: Min guard         General transfer comment: vc's for hand placement, min-guard for safety  Ambulation/Gait Ambulation/Gait assistance: Min guard Gait Distance (Feet): 75 Feet Assistive device: Rolling walker (2 wheeled) Gait Pattern/deviations: Step-through pattern;Decreased stride length;Antalgic Gait velocity:  decreased Gait velocity interpretation: <1.8 ft/sec, indicate of risk for recurrent falls General Gait Details: guarded gait, vc's for posture  Stairs            Wheelchair Mobility    Modified Rankin (Stroke Patients Only)       Balance Overall balance assessment: Mild deficits observed, not formally tested                                           Pertinent Vitals/Pain Pain Assessment: 0-10 Pain Score: 3  Pain Location: back, RLE Pain Descriptors / Indicators: Aching;Sore;Operative site guarding Pain Intervention(s): Limited activity within patient's tolerance;Monitored during session;Premedicated before session    Home Living Family/patient expects to be discharged to:: Private residence Living Arrangements: Alone Available Help at Discharge: Family;Available PRN/intermittently Type of Home: House Home Access: Stairs to enter Entrance Stairs-Rails: Right Entrance Stairs-Number of Steps: 3 Home Layout: One level Home Equipment: None      Prior Function Level of Independence: Independent         Comments: retired from Printmaker, loves to garden, shopping     Journalist, newspaper        Extremity/Trunk Assessment   Upper Extremity Assessment Upper Extremity Assessment: Overall WFL for tasks assessed    Lower Extremity Assessment Lower Extremity Assessment: RLE deficits/detail RLE Deficits / Details: mild weakness noted R quad RLE Sensation: decreased light touch RLE Coordination: WNL    Cervical / Trunk Assessment Cervical / Trunk Assessment: Normal  Communication   Communication: No difficulties  Cognition Arousal/Alertness: Awake/alert Behavior During Therapy: WFL for tasks assessed/performed Overall Cognitive Status: Within Functional Limits for tasks assessed  General Comments      Exercises     Assessment/Plan    PT Assessment Patient needs continued PT services   PT Problem List Decreased strength;Decreased activity tolerance;Decreased balance;Decreased mobility;Decreased knowledge of use of DME;Decreased knowledge of precautions;Pain       PT Treatment Interventions DME instruction;Gait training;Stair training;Functional mobility training;Therapeutic activities;Therapeutic exercise;Patient/family education;Neuromuscular re-education    PT Goals (Current goals can be found in the Care Plan section)  Acute Rehab PT Goals Patient Stated Goal: return home, garden again PT Goal Formulation: With patient Time For Goal Achievement: 09/09/18 Potential to Achieve Goals: Good    Frequency Min 5X/week   Barriers to discharge        Co-evaluation               AM-PAC PT "6 Clicks" Mobility  Outcome Measure Help needed turning from your back to your side while in a flat bed without using bedrails?: A Little Help needed moving from lying on your back to sitting on the side of a flat bed without using bedrails?: A Little Help needed moving to and from a bed to a chair (including a wheelchair)?: A Little Help needed standing up from a chair using your arms (e.g., wheelchair or bedside chair)?: A Little Help needed to walk in hospital room?: A Little Help needed climbing 3-5 steps with a railing? : A Little 6 Click Score: 18    End of Session Equipment Utilized During Treatment: Gait belt;Back brace Activity Tolerance: Patient tolerated treatment well Patient left: in bed;with call bell/phone within reach Nurse Communication: Mobility status PT Visit Diagnosis: Difficulty in walking, not elsewhere classified (R26.2);Pain Pain - Right/Left: Right Pain - part of body: Leg    Time: 3704-8889 PT Time Calculation (min) (ACUTE ONLY): 34 min   Charges:   PT Evaluation $PT Eval Low Complexity: 1 Low PT Treatments $Gait Training: 8-22 mins        [place]  Pager [phone] Office  [place] 09/02/2018, 4:56 PM

## 2018-09-03 ENCOUNTER — Ambulatory Visit: Payer: BC Managed Care – PPO | Admitting: Vascular Surgery

## 2018-09-03 ENCOUNTER — Encounter (HOSPITAL_COMMUNITY): Payer: Self-pay | Admitting: Neurosurgery

## 2018-09-03 MED ORDER — PANTOPRAZOLE SODIUM 40 MG PO TBEC: 40.0000 mg | DELAYED_RELEASE_TABLET | Freq: Every day | ORAL | Status: DC

## 2018-09-03 MED ORDER — HYDROCODONE-ACETAMINOPHEN 5-325 MG PO TABS: 1.0000 | ORAL_TABLET | ORAL | 0 refills | Status: DC | PRN

## 2018-09-03 NOTE — TOC Initial Note (Signed)
Transition of Care [facility]) - Initial/Assessment Note    Patient Details  Name: Dana Cook MRN: [id number] Date of Birth: Jun 04, 1961  Transition of Care [facility]) CM/SW Contact:    Pollie Friar, RN Phone Number: 09/03/2018, 12:44 PM  Clinical Narrative:                   Expected Discharge Plan: [place] Barriers to Discharge: Continued Medical Work up   Patient Goals and CMS Choice Patient states their goals for this hospitalization and ongoing recovery are:: to be at home CMS Medicare.gov Compare Post Acute Care list provided to:: Patient Choice offered to / list presented to : Patient  Expected Discharge Plan and Services Expected Discharge Plan: [place]   Discharge Planning Services: CM Consult Post Acute Care Choice: Home Health, Durable Medical Equipment Living arrangements for the past 2 months: Single Family Home(one level) Expected Discharge Date: 09/03/18               DME Arranged: [place] Rile rolling DME Agency: [facility] HH Arranged: PT [place] Agency: [place] ([facility])  Prior Living Arrangements/Services Living arrangements for the past 2 months: Single Family Home(one level) Lives with:: Self Patient language and need for interpreter reviewed:: Yes(no needs) Do you feel safe going back to the place where you live?: Yes      Need for Family Participation in Patient Care: Yes (Comment)(assistance at home) Care giver support system in place?: Yes (comment)(pt's niece and mother are going to stay and provide assistance)   Criminal Activity/Legal Involvement Pertinent to Current Situation/Hospitalization: No - Comment as needed  Activities of Daily Living Home Assistive Devices/Equipment: None ADL Screening (condition at time of admission) Patient's cognitive ability adequate to safely complete daily activities?: Yes Is the patient deaf or have difficulty hearing?: No Does the patient have difficulty seeing,  even when wearing glasses/contacts?: No Does the patient have difficulty concentrating, remembering, or making decisions?: No Patient able to express need for assistance with ADLs?: Yes Does the patient have difficulty dressing or bathing?: No Independently performs ADLs?: Yes (appropriate for developmental age) Does the patient have difficulty walking or climbing stairs?: No Weakness of Legs: None Weakness of Arms/Hands: None  Permission Sought/Granted                  Emotional Assessment Appearance:: Appears stated age Attitude/Demeanor/Rapport: Gracious Affect (typically observed): Accepting, Pleasant, Appropriate Orientation: : Oriented to Self, Oriented to Place, Oriented to  Time, Oriented to Situation   Psych Involvement: No (comment)  Admission diagnosis:  Lumbar foraminal stenosis Patient Active Problem List   Diagnosis Date Noted  . Lumbar scoliosis 09/02/2018  . Epigastric pain 12/10/2017  . Chronic midline low back pain without sciatica 10/31/2016  . RLS (restless legs syndrome) 12/05/2015  . Contraception management 10/07/2013  . Insomnia 10/07/2013  . Atypical glandular cells of undetermined significance (AGUS) on cervical Pap smear    PCP:  Eulas Post, MD Pharmacy:   [facility] [place] [address] [place] [address] Phone: [phone] Fax: [phone]     Social Determinants of Health (SDOH) Interventions    Readmission Risk Interventions No flowsheet data found.

## 2018-09-03 NOTE — Progress Notes (Addendum)
Subjective: Patient reports "I don't hurt" "I feel a little more bloated this morning"  Objective: Vital signs in last 24 hours: Temp:  [97.2 F (36.2 C)-99 F (37.2 C)] 98.4 F (36.9 C) (03/25 0353) Pulse Rate:  [68-99] 99 (03/25 0353) Resp:  [12-18] 18 (03/25 0353) BP: (95-123)/(54-87) 99/60 (03/25 0353) SpO2:  [100 %] 100 % (03/25 0353)  Intake/Output from previous day: 03/24 0701 - 03/25 0700 In: 2610.9 [P.O.:444; I.V.:1966.9; IV Piggyback:200] Out: 152 [Urine:102; Blood:50] Intake/Output this shift: No intake/output data recorded.  Alert, conversant. Good strength BLE. Reports no pain. Incision LLQ without erythema, swelling or drainage beneath honeycomb and Dermabond. Belly soft, but pt feels bloated. No BM yet, not passing gas. Belching. Vomited x2 overnight.   Lab Results: No results for input(s): WBC, HGB, HCT, PLT in the last 72 hours. BMET No results for input(s): NA, K, CL, CO2, GLUCOSE, BUN, CREATININE, CALCIUM in the last 72 hours.  Studies/Results: Dg Lumbar Spine 2-3 Views  Result Date: 09/02/2018 CLINICAL DATA:  L5-S1 A LIF EXAM: DG C-ARM 61-120 MIN; LUMBAR SPINE - 2-3 VIEW COMPARISON:  None. FINDINGS: Two fluoroscopic images of the lumbar spine are provided. Anterior lumbar interbody fusion at L5-S1. IMPRESSION: Anterior lumbar interbody fusion at L5-S1. Electronically Signed   By: Kathreen Devoid   On: 09/02/2018 10:07   Dg C-arm 1-60 Min  Result Date: 09/02/2018 CLINICAL DATA:  L5-S1 A LIF EXAM: DG C-ARM 61-120 MIN; LUMBAR SPINE - 2-3 VIEW COMPARISON:  None. FINDINGS: Two fluoroscopic images of the lumbar spine are provided. Anterior lumbar interbody fusion at L5-S1. IMPRESSION: Anterior lumbar interbody fusion at L5-S1. Electronically Signed   By: Kathreen Devoid   On: 09/02/2018 10:07   Dg Or Local Abdomen  Result Date: 09/02/2018 CLINICAL DATA:  L5-S1 ALIF.  Evaluate for retained foreign body. EXAM: OR LOCAL ABDOMEN COMPARISON:  Lumbar spine x-rays from same  day and June 16, 2018. FINDINGS: Interval L5-S1 ALIF. No radiopaque foreign body identified. The bowel gas pattern is normal. No radio-opaque calculi or other significant radiographic abnormality are seen. IMPRESSION: No radiopaque foreign body. These results were called by telephone at the time of interpretation on 09/02/2018 at 9:45 am to ESTER RADCLIFF, who verbally acknowledged these results. Electronically Signed   By: Titus Dubin M.D.   On: 09/02/2018 09:46    Assessment/Plan: improving  LOS: 1 day  Mobilize in LSO. Pt will request Dulcolax suppository this am. Plan for d/c to home today if passing gas or BM. She verbalizes understanding of d/c instructions and agrees to call office to schedule 3-4 wk f/u appt. Rx's will be eRx'ed to her pharmacy from the office.   Verdis Prime 09/03/2018, 8:03 AM   Discharge home.  Doing well.

## 2018-09-03 NOTE — Discharge Summary (Signed)
Physician Discharge Summary  Patient ID: Dana Cook MRN: [id number] DOB/AGE: [birth year] 57 y.o.  Admit date: 09/02/2018 Discharge date: 09/03/2018  Admission Diagnoses:  Lumbar foraminal stenosis, lumbar scoliosis, herniated lumbar disc, lumbago, radiculopathy      Discharge Diagnoses: Lumbar foraminal stenosis, lumbar scoliosis, herniated lumbar disc, lumbago, radiculopathy  S/p Lumbar five Sacral one  Anterior lumbar interbody fusion (N/A) ABDOMINAL EXPOSURE (N/A) Titanium Cage and screws        Active Problems:   Lumbar scoliosis   Discharged Condition: good  Hospital Course: Dana Cook was admitted for surgery with lumbar radiculopathy and leg weakness. Following uncomplicated ALIF at Z6-O2, she recovered well and transferred to 3W for nursing care and therapies. She is mobilizing well with full strength and resolution of pain.   Consults: vascular surgery  Significant Diagnostic Studies: radiology: X-Ray: intra-op  Treatments: surgery: Lumbar five Sacral one  Anterior lumbar interbody fusion (N/A) ABDOMINAL EXPOSURE (N/A) Titanium Cage and screws      Discharge Exam: Blood pressure 98/71, pulse 95, temperature 98.2 F (36.8 C), temperature source Oral, resp. rate 18, height 5\' 3"  (1.6 m), weight 58.5 kg, last menstrual period 12/10/2011, SpO2 100 %. Alert, conversant. Good strength BLE. Reports no pain. Incision LLQ without erythema, swelling or drainage beneath honeycomb and Dermabond. Belly soft, but pt feels bloated. Working on bowels this morning before discharge.    Disposition: Discharge disposition: 01-Home or Self Care. HHPT. She verbalizes understanding of d/c instructions and agrees to call office to schedule 3-4 wk f/u appt. Rx's will be eRx'ed to her pharmacy from the office.          Discharge Instructions    Diet - low sodium heart healthy   Complete by:  As directed    Increase activity slowly   Complete by:  As directed       Allergies as of 09/03/2018      Reactions   Erythromycin Nausea And Vomiting   Stomach cramps      Medication List    TAKE these medications   amitriptyline 10 MG tablet Commonly known as:  ELAVIL Take 1 tablet (10 mg total) by mouth at bedtime as needed for sleep. What changed:  how much to take   HYDROcodone-acetaminophen 5-325 MG tablet Commonly known as:  NORCO/VICODIN Take 1 tablet by mouth every 4 (four) hours as needed for moderate pain ((score 4 to 6)).   multivitamin with minerals Tabs tablet Take 1 tablet by mouth daily.   Osteo Bi-Flex Triple Strength Tabs Take 1 tablet by mouth daily.   rOPINIRole 0.5 MG tablet Commonly known as:  REQUIP Take 1 tablet (0.5 mg total) by mouth 3 (three) times daily. What changed:    when to take this  reasons to take this   SYSTANE OP Place 1 drop into both eyes 2 (two) times daily as needed (dry eyes).        Signed: Verdis Prime 09/03/2018, 9:32 AM

## 2018-09-03 NOTE — TOC Transition Note (Signed)
Transition of Care [facility]) - CM/SW Discharge Note   Patient Details  Name: Dana Cook MRN: [id number] Date of Birth: 1960-07-25  [facility]) CM/SW Contact:  Pollie Friar, RN Phone Number: 09/03/2018, 12:52 PM   Clinical Narrative:    Pt discharging home. She has transportation arranged.    Final next level of care: [place] Barriers to Discharge: Continued Medical Work up   Patient Goals and CMS Choice Patient states their goals for this hospitalization and ongoing recovery are:: to be at home CMS Medicare.gov Compare Post Acute Care list provided to:: Patient Choice offered to / list presented to : Patient  Discharge Placement                       Discharge Plan and Services   Discharge Planning Services: CM Consult Post Acute Care Choice: Home Health, Durable Medical Equipment          DME Arranged: Walker rolling DME Agency: [facility] HH Arranged: PT [place] Agency: [place] ([facility])--Donna with [facility] accepted the referral.   Social Determinants of Health (SDOH) Interventions     Readmission Risk Interventions No flowsheet data found.

## 2018-09-03 NOTE — Progress Notes (Signed)
Physical Therapy Treatment Patient Details Name: Dana Cook MRN: [id number] DOB: 13-Nov-1960 Today's Date: 09/03/2018    History of Present Illness Pt presents for ALIF L5-S1 due to pain and radicular symptoms down RLE. PMH: OA    PT Comments    Pt making good progress with functional mobility. She tolerated stair training this session with no difficulties. PT reviewed 3/3 back precautions with pt as well as discussed a generalized walking program for pt to initiate upon d/c home. Pt would continue to benefit from skilled physical therapy services at this time while admitted and after d/c to address the below listed limitations in order to improve overall safety and independence with functional mobility.    Follow Up Recommendations  Home health PT     Equipment Recommendations  Rolling walker with 5" wheels    Recommendations for Other Services       Precautions / Restrictions Precautions Precautions: Back Precaution Booklet Issued: Yes (comment) Precaution Comments: pt able to recall 2/3 back precautions; reviewed in full throughout session Required Braces or Orthoses: Spinal Brace Spinal Brace: Lumbar corset;Applied in sitting position Restrictions Weight Bearing Restrictions: No    Mobility  Bed Mobility Overal bed mobility: Needs Assistance Bed Mobility: Rolling;Sidelying to Sit;Sit to Sidelying Rolling: Supervision Sidelying to sit: Supervision     Sit to sidelying: Supervision General bed mobility comments: pt OOB in recliner chair upon arrival  Transfers Overall transfer level: Needs assistance Equipment used: Rolling walker (2 wheeled) Transfers: Sit to/from Stand Sit to Stand: Min guard         General transfer comment: vc's for hand placement, min-guard for safety  Ambulation/Gait Ambulation/Gait assistance: Min guard Gait Distance (Feet): 150 Feet Assistive device: Rolling walker (2 wheeled) Gait Pattern/deviations: Step-through  pattern;Decreased stride length Gait velocity: decreased   General Gait Details: pt steady with RW, occasional cueing to maintain proximity, no LOB or need for physical assistance   Stairs Stairs: Yes Stairs assistance: Min guard;Supervision Stair Management: One rail Right;Step to pattern;Sideways;Forwards Number of Stairs: 3(3 steps x4 rounds) General stair comments: cueing for technique, use of R sided railing only to simulate home set-up   Wheelchair Mobility    Modified Rankin (Stroke Patients Only)       Balance Overall balance assessment: Needs assistance Sitting-balance support: Feet supported Sitting balance-Leahy Scale: Good     Standing balance support: During functional activity Standing balance-Leahy Scale: Fair                              Cognition Arousal/Alertness: Awake/alert Behavior During Therapy: WFL for tasks assessed/performed Overall Cognitive Status: Within Functional Limits for tasks assessed                                 General Comments: pt able to problem solve hypothetical scenarios upon d.c. to adhere to precautions, pt required mod vc for reminders to her precautions;pt demonstrated good problem solving skills to work through scenarios at home to modify and compensate for adherence to back precautions;      Exercises      General Comments General comments (skin integrity, edema, etc.): educated and emphasized importance of adherence to back precautions;discussed pt's daily routine to problem solve compensatory strategies, environmental modifications for adherence to precautions      Pertinent Vitals/Pain Pain Assessment: No/denies pain Pain Score: 2  Pain Location: back, RLE Pain  Descriptors / Indicators: Aching;Sore;Operative site guarding Pain Intervention(s): Limited activity within patient's tolerance;Monitored during session    [place] expects to be discharged to:: Private  residence Living Arrangements: Alone Available Help at Discharge: Family;Available PRN/intermittently Type of Home: House Home Access: Stairs to enter Entrance Stairs-Rails: Right Home Layout: One level Home Equipment: None Additional Comments: pt states her mother has a Secondary school teacher and she had a shower chair available upon d.c     Prior Function Level of Independence: Independent      Comments: retired from Printmaker, loves to garden, shopping   PT Goals (current goals can now be found in the care plan section) Acute Rehab PT Goals Patient Stated Goal: take care of her dogs PT Goal Formulation: With patient Time For Goal Achievement: 09/09/18 Potential to Achieve Goals: Good Progress towards PT goals: Progressing toward goals    Frequency    Min 5X/week      PT Plan Current plan remains appropriate    Co-evaluation              AM-PAC PT "6 Clicks" Mobility   Outcome Measure  Help needed turning from your back to your side while in a flat bed without using bedrails?: A Little Help needed moving from lying on your back to sitting on the side of a flat bed without using bedrails?: A Little Help needed moving to and from a bed to a chair (including a wheelchair)?: A Little Help needed standing up from a chair using your arms (e.g., wheelchair or bedside chair)?: A Little Help needed to walk in hospital room?: A Little Help needed climbing 3-5 steps with a railing? : A Little 6 Click Score: 18    End of Session Equipment Utilized During Treatment: Back brace Activity Tolerance: Patient tolerated treatment well Patient left: in chair;with call bell/phone within reach Nurse Communication: Mobility status PT Visit Diagnosis: Difficulty in walking, not elsewhere classified (R26.2)     Time: 1007-1219 PT Time Calculation (min) (ACUTE ONLY): 17 min  Charges:  $Gait Training: 8-22 mins                     Sherie Don, PT, DPT  Acute Rehabilitation  Services Pager [phone] Office [place] 09/03/2018, 10:53 AM

## 2018-09-03 NOTE — Progress Notes (Addendum)
Occupational Therapy Evaluation Patient Details Name: Dana Cook MRN: [id number] DOB: July 01, 1960 Today's Date: 09/03/2018    History of Present Illness Pt presents for ALIF L5-S1 due to pain and radicular symptoms down RLE. PMH: OA   Clinical Impression   PTA, pt was living at home alone, and was independent with ADL/IADL and functional mobility. PTA, pt was driving, gardening, and taking care of her 2 dogs. Pt currently requires S to min-guard A for ADL and functional mobility with mod vc progressing to min vc for adherence to back precautions. Pt demonstrated problem solving skills for adherence to back precautions in hypothetical scenarios upon d/c home. Pt will continue to benefit from skilled OT services following d.c home with [place]. Pt's current level of functioning appropriate for d.c., all further OT needs can be addressed at next venue of care. OT will sign off.    Follow Up Recommendations  Supervision - Intermittent;Home health OT    Equipment Recommendations  None recommended by OT    Recommendations for Other Services       Precautions / Restrictions Precautions Precautions: Back Precaution Booklet Issued: Yes (comment) Precaution Comments: reviewed precautions and proper posture Required Braces or Orthoses: Spinal Brace Spinal Brace: Lumbar corset;Applied in sitting position Restrictions Weight Bearing Restrictions: No      Mobility Bed Mobility Overal bed mobility: Needs Assistance Bed Mobility: Rolling;Sidelying to Sit;Sit to Sidelying Rolling: Supervision Sidelying to sit: Supervision     Sit to sidelying: Supervision General bed mobility comments: pt familiar with log rolling from PT before surgery. pt demonstrated good carryover of logrolling technique from PT session  Transfers Overall transfer level: Needs assistance   Transfers: Sit to/from Stand Sit to Stand: Min guard         General transfer comment: vc's for hand placement,  min-guard for safety    Balance Overall balance assessment: Mild deficits observed, not formally tested                                         ADL either performed or assessed with clinical judgement   ADL Overall ADL's : Needs assistance/impaired                                     Functional mobility during ADLs: Supervision/safety;Min guard General ADL Comments: pt overall S-min-guard for ADL for adherence to back precautions;educated pt on good body mechanics to utlize during ADL for adherence to precautions;pt required mod verbal cueing for adherence to precautions, but able to demonstrate problem solving skills to increase adherence to precautions;pt asking about available AE to assist with ADL if needed, discussed available options with pt, recommended pt would benefit from use of reacher, but does not need additional AE based on current level of functioning     Vision         Perception     Praxis      Pertinent Vitals/Pain Pain Assessment: 0-10 Pain Score: 2  Pain Location: back, RLE Pain Descriptors / Indicators: Aching;Sore;Operative site guarding Pain Intervention(s): Limited activity within patient's tolerance;Monitored during session     Hand Dominance Right   Extremity/Trunk Assessment Upper Extremity Assessment Upper Extremity Assessment: Overall WFL for tasks assessed   Lower Extremity Assessment Lower Extremity Assessment: Defer to PT evaluation   Cervical / Trunk Assessment Cervical /  Trunk Assessment: Normal   Communication Communication Communication: No difficulties   Cognition Arousal/Alertness: Awake/alert Behavior During Therapy: WFL for tasks assessed/performed Overall Cognitive Status: Within Functional Limits for tasks assessed                                 General Comments: pt able to problem solve hypothetical scenarios upon d.c. to adhere to precautions, pt required mod vc for  reminders to her precautions;pt demonstrated good problem solving skills to work through scenarios at home to modify and compensate for adherence to back precautions;   General Comments  educated and emphasized importance of adherence to back precautions;discussed pt's daily routine to problem solve compensatory strategies, environmental modifications for adherence to precautions    Exercises     Shoulder Instructions      Home Living Family/patient expects to be discharged to:: Private residence Living Arrangements: Alone Available Help at Discharge: Family;Available PRN/intermittently Type of Home: House Home Access: Stairs to enter [employer] of Steps: 3 Entrance Stairs-Rails: Right Home Layout: One level     Bathroom Shower/Tub: Tub/shower unit;Walk-in shower   Bathroom Toilet: Standard     Home Equipment: None   Additional Comments: pt states her mother has a Secondary school teacher and she had a shower chair available upon d.c       Prior Functioning/Environment Level of Independence: Independent        Comments: retired from [employer], loves to garden, shopping        OT Problem List: Decreased activity tolerance;Impaired balance (sitting and/or standing);Decreased safety awareness;Decreased knowledge of precautions;Pain      OT Treatment/Interventions:      OT Goals(Current goals can be found in the care plan section) Acute Rehab OT Goals Patient Stated Goal: take care of her dogs OT Goal Formulation: With patient Time For Goal Achievement: 09/17/18 Potential to Achieve Goals: Good  OT Frequency:     Barriers to D/C:            Co-evaluation              AM-PAC OT "6 Clicks" Daily Activity     Outcome Measure Help from another person eating meals?: None Help from another person taking care of personal grooming?: A Little Help from another person toileting, which includes using toliet, bedpan, or urinal?: A Little Help from another person bathing  (including washing, rinsing, drying)?: A Little Help from another person to put on and taking off regular upper body clothing?: None Help from another person to put on and taking off regular lower body clothing?: A Little 6 Click Score: 20   End of Session Equipment Utilized During Treatment: Back brace Nurse Communication: Mobility status  Activity Tolerance: Patient tolerated treatment well Patient left: in chair;with call bell/phone within reach  OT Visit Diagnosis: Unsteadiness on feet (R26.81);Other abnormalities of gait and mobility (R26.89);Pain Pain - part of body: (back)                Time: 0867-6195 OT Time Calculation (min): 34 min Charges:  OT General Charges $OT Visit: 1 Visit OT Evaluation $OT Eval Low Complexity: 1 Low OT Treatments $Self Care/Home Management : 8-22 mins  Dorinda Hill OTR/L Acute Rehabilitation Services Office: [place] 09/03/2018, 10:40 AM

## 2018-09-04 MED FILL — Heparin Sodium (Porcine) Inj 1000 Unit/ML: INTRAMUSCULAR | Qty: 30 | Status: AC

## 2018-09-04 MED FILL — Sodium Chloride IV Soln 0.9%: INTRAVENOUS | Qty: 1000 | Status: AC

## 2018-12-01 ENCOUNTER — Other Ambulatory Visit (HOSPITAL_COMMUNITY): Payer: Self-pay | Admitting: Obstetrics & Gynecology

## 2018-12-01 DIAGNOSIS — Z1231 Encounter for screening mammogram for malignant neoplasm of breast: Secondary | ICD-10-CM

## 2019-01-12 ENCOUNTER — Other Ambulatory Visit: Payer: Self-pay

## 2019-01-12 ENCOUNTER — Other Ambulatory Visit (HOSPITAL_COMMUNITY): Payer: Self-pay | Admitting: Obstetrics & Gynecology

## 2019-01-12 ENCOUNTER — Ambulatory Visit (HOSPITAL_COMMUNITY)
Admission: RE | Admit: 2019-01-12 | Discharge: 2019-01-12 | Disposition: A | Discharge status: Home / Self Care | Payer: BC Managed Care – PPO | Source: Ambulatory Visit | Attending: Obstetrics & Gynecology | Admitting: Obstetrics & Gynecology

## 2019-01-12 DIAGNOSIS — Z1231 Encounter for screening mammogram for malignant neoplasm of breast: Secondary | ICD-10-CM | POA: Diagnosis not present

## 2019-01-13 ENCOUNTER — Other Ambulatory Visit (HOSPITAL_COMMUNITY): Payer: Self-pay | Admitting: Obstetrics & Gynecology

## 2019-01-13 DIAGNOSIS — R928 Other abnormal and inconclusive findings on diagnostic imaging of breast: Secondary | ICD-10-CM

## 2019-01-27 ENCOUNTER — Encounter (HOSPITAL_COMMUNITY): Payer: BC Managed Care – PPO

## 2019-01-27 ENCOUNTER — Ambulatory Visit (HOSPITAL_COMMUNITY)
Admission: RE | Admit: 2019-01-27 | Discharge: 2019-01-27 | Disposition: A | Discharge status: Home / Self Care | Payer: BC Managed Care – PPO | Source: Ambulatory Visit | Attending: Obstetrics & Gynecology | Admitting: Obstetrics & Gynecology

## 2019-01-27 ENCOUNTER — Other Ambulatory Visit (HOSPITAL_COMMUNITY): Payer: BC Managed Care – PPO

## 2019-01-27 ENCOUNTER — Other Ambulatory Visit: Payer: Self-pay

## 2019-01-27 DIAGNOSIS — R928 Other abnormal and inconclusive findings on diagnostic imaging of breast: Secondary | ICD-10-CM

## 2019-02-10 ENCOUNTER — Other Ambulatory Visit: Payer: Self-pay

## 2019-02-12 ENCOUNTER — Ambulatory Visit: Payer: BC Managed Care – PPO | Admitting: Obstetrics & Gynecology

## 2019-02-12 ENCOUNTER — Other Ambulatory Visit: Payer: Self-pay

## 2019-02-12 ENCOUNTER — Encounter: Payer: Self-pay | Admitting: Obstetrics & Gynecology

## 2019-02-12 ENCOUNTER — Other Ambulatory Visit (HOSPITAL_COMMUNITY)
Admission: RE | Admit: 2019-02-12 | Discharge: 2019-02-12 | Disposition: A | Discharge status: Home / Self Care | Payer: BC Managed Care – PPO | Source: Ambulatory Visit | Attending: Obstetrics & Gynecology | Admitting: Obstetrics & Gynecology

## 2019-02-12 ENCOUNTER — Encounter

## 2019-02-12 VITALS — BP 108/82 | HR 84 | Temp 97.5°F | Ht 63.5 in | Wt 131.0 lb

## 2019-02-12 DIAGNOSIS — Z01411 Encounter for gynecological examination (general) (routine) with abnormal findings: Secondary | ICD-10-CM | POA: Diagnosis not present

## 2019-02-12 DIAGNOSIS — Z124 Encounter for screening for malignant neoplasm of cervix: Secondary | ICD-10-CM

## 2019-02-12 NOTE — Progress Notes (Signed)
58 y.o. G0P0000 Single White or Caucasian female here for annual exam.  Didn't get her pneumonia vaccination last year.  Would like rx for pneumonia vaccination.    Had back surgery 09/02/2018.  States she was the last non-essential surgery done at [facility] before Covid shutdown.  Has done great since surgery.  Had done PT prior to surgery so she knew what to do post-op.  Had five at home post op visits.  Really happy she had this done.    Weight is stable.  Down about 50 pounds total.   Patient's last menstrual period was 12/10/2011 (approximate).          Sexually active: No.  The current method of family planning is post menopausal status.    Exercising: Yes.    daily Smoker:  no  Health Maintenance: Pap:  11/01/17 Neg   10/01/16 Neg. HR HPV:neg  History of abnormal Pap:  yes MMG:  01/27/19 US/Diagnostic Right BIRADS1:neg. F/u 1 year Colonoscopy:  10/15/13 Normal. F/u 10 years  BMD:   01/05/16 Osteopenia, follow up 5 years TDaP:  2011 Pneumonia vaccine(s):  Planning on having this done this year. Shingrix:  Considering getting this during the next year.   Hep C testing: 09/27/15 Neg Screening Labs: PCP   reports that she has never smoked. She has never used smokeless tobacco. She reports that she does not drink alcohol or use drugs.  Past Medical History:  Diagnosis Date  . Abnormal Pap smear of cervix 2008  . Allergy   . Atypical glandular cells of undetermined significance (AGUS) on cervical Pap smear 2008   TWICE, D&C negative  . Chicken pox   . Chronic sinusitis   . Osteoarthritis     Past Surgical History:  Procedure Laterality Date  . ABDOMINAL EXPOSURE N/A 09/02/2018   Procedure: ABDOMINAL EXPOSURE;  Surgeon: Serafina Mitchell, MD;  Location: [place];  Service: Vascular;  Laterality: N/A;  . ANTERIOR LUMBAR FUSION N/A 09/02/2018   Procedure: Lumbar five Sacral one  Anterior lumbar interbody fusion;  Surgeon: Erline Levine, MD;  Location: [facility];  Service: Neurosurgery;   Laterality: N/A;  . COLPOSCOPY  2015   LSIL  . DILATION AND CURETTAGE OF UTERUS  2008  . FINGER FRACTURE SURGERY Right 1983   5th finger    Current Outpatient Medications  Medication Sig Dispense Refill  . amitriptyline (ELAVIL) 10 MG tablet Take 1 tablet (10 mg total) by mouth at bedtime as needed for sleep. (Patient taking differently: Take 2.5 mg by mouth at bedtime as needed for sleep. ) 30 tablet 2  . methocarbamol (ROBAXIN) 500 MG tablet     . Misc Natural Products (OSTEO BI-FLEX TRIPLE STRENGTH) TABS Take 1 tablet by mouth daily.    . Multiple Vitamin (MULTIVITAMIN WITH MINERALS) TABS tablet Take 1 tablet by mouth daily.    Vladimir Faster Glycol-Propyl Glycol (SYSTANE OP) Place 1 drop into both eyes 2 (two) times daily as needed (dry eyes).    Marland Kitchen rOPINIRole (REQUIP) 0.5 MG tablet Take 1 tablet (0.5 mg total) by mouth 3 (three) times daily. (Patient taking differently: Take 0.5 mg by mouth at bedtime as needed (restless leg). ) 90 tablet 0   No current facility-administered medications for this visit.     Family History  Problem Relation Age of Onset  . Hypertension Mother   . Heart disease Father   . Hypertension Father   . Hypertension Paternal Grandmother   . Colon cancer Neg Hx  Review of Systems  Constitutional: Positive for fatigue.  All other systems reviewed and are negative.   Exam:   BP 108/82   Pulse 84   Temp (!) 97.5 F (36.4 C) (Temporal)   Ht 5' 3.5" (1.613 m)   Wt 131 lb (59.4 kg)   LMP 12/10/2011 (Approximate)   BMI 22.84 kg/m   Height: 5' 3.5" (161.3 cm)  Ht Readings from Last 3 Encounters:  02/12/19 5' 3.5" (1.613 m)  09/02/18 5\' 3"  (1.6 m)  08/20/18 5\' 3"  (1.6 m)    General appearance: alert, cooperative and appears stated age Head: Normocephalic, without obvious abnormality, atraumatic Neck: no adenopathy, supple, symmetrical, trachea midline and thyroid normal to inspection and palpation Lungs: clear to auscultation bilaterally Breasts:  normal appearance, no masses or tenderness Heart: regular rate and rhythm Abdomen: soft, non-tender; bowel sounds normal; no masses,  no organomegaly Extremities: extremities normal, atraumatic, no cyanosis or edema Skin: Skin color, texture, turgor normal. No rashes or lesions Lymph nodes: Cervical, supraclavicular, and axillary nodes normal. No abnormal inguinal nodes palpated Neurologic: Grossly normal   Pelvic: External genitalia:  no lesions              Urethra:  normal appearing urethra with no masses, tenderness or lesions              Bartholins and Skenes: normal                 Vagina: normal appearing vagina with normal color and discharge, no lesions              Cervix: no lesions              Pap taken: No. Bimanual Exam:  Uterus:  normal size, contour, position, consistency, mobility, non-tender              Adnexa: normal adnexa and no mass, fullness, tenderness               Rectovaginal: Confirms               Anus:  normal sphincter tone, no lesions  Chaperone was present for exam.   A:  Well Woman with normal exam PMP, no HRT H/o AGUS pap 2008 and 2010, h/o D&C H/o osteoarthritis Insomnia, chronic H/o pneumonia  P:   Mammogram guidelines reviewed.  UTD. pap smear obtained today per pt request.  Neg pap with neg HR HPV 2018. Lab work done 2019 Rx for pneumonia vaccination Does not elavil rx this year.  She will call if needs RF. Tdap due next year return annually or prn

## 2019-02-13 LAB — CYTOLOGY - PAP: Diagnosis: NEGATIVE

## 2019-05-30 ENCOUNTER — Other Ambulatory Visit: Payer: Self-pay | Admitting: Family Medicine

## 2019-06-01 ENCOUNTER — Telehealth: Payer: Self-pay

## 2019-06-01 NOTE — Telephone Encounter (Signed)
Called patient and left a detailed voice message to let patient know that she needs to set up an appointment for her refills. Can be virtual or by telephone.  OK for PEC to discuss, advise, and schedule patient for this appointment.  CRM Created.

## 2019-06-09 ENCOUNTER — Encounter: Payer: Self-pay | Admitting: Family Medicine

## 2019-06-09 ENCOUNTER — Other Ambulatory Visit: Payer: Self-pay

## 2019-06-09 ENCOUNTER — Telehealth (INDEPENDENT_AMBULATORY_CARE_PROVIDER_SITE_OTHER): Payer: BC Managed Care – PPO | Admitting: Family Medicine

## 2019-06-09 DIAGNOSIS — G2581 Restless legs syndrome: Secondary | ICD-10-CM

## 2019-06-09 MED ORDER — ROPINIROLE HCL 0.5 MG PO TABS: 0.5000 mg | ORAL_TABLET | Freq: Every day | ORAL | 3 refills | Status: DC

## 2019-06-09 NOTE — Progress Notes (Signed)
This visit type was conducted due to national recommendations for restrictions regarding the COVID-19 pandemic in an effort to limit this patient's exposure and mitigate transmission in our community.   Virtual Visit via Telephone Note  I connected with [name] on 06/09/19 at  9:45 AM EST by telephone and verified that I am speaking with the correct person using two identifiers.   I discussed the limitations, risks, security and privacy concerns of performing an evaluation and management service by telephone and the availability of in person appointments. I also discussed with the patient that there may be a patient responsible charge related to this service. The patient expressed understanding and agreed to proceed.  Location patient: home Location provider: work or home office Participants present for the call: patient, provider Patient did not have a visit in the prior 7 days to address this/these issue(s).   History of Present Illness: Dana Cook needs refills of Requip.  She has restless leg syndrome.  Her symptoms seem to be somewhat intermittent.  She does not take the medication every night but does most nights.  This seems to be controlling things very well for her at dosage of 0.5 mg.  She had low back surgery back in March right at the beginning of the pandemic and has done very well since then.  Her back pain was much improved following surgery.  She is still followed by GYN yearly  Past Medical History:  Diagnosis Date  . Abnormal Pap smear of cervix 2008  . Allergy   . Atypical glandular cells of undetermined significance (AGUS) on cervical Pap smear 2008   TWICE, D&C negative  . Chicken pox   . Chronic sinusitis   . Osteoarthritis    Past Surgical History:  Procedure Laterality Date  . ABDOMINAL EXPOSURE N/A 09/02/2018   Procedure: ABDOMINAL EXPOSURE;  Surgeon: Serafina Mitchell, MD;  Location: [facility];  Service: Vascular;  Laterality: N/A;  . ANTERIOR LUMBAR FUSION N/A  09/02/2018   Procedure: Lumbar five Sacral one  Anterior lumbar interbody fusion;  Surgeon: Erline Levine, MD;  Location: [facility];  Service: Neurosurgery;  Laterality: N/A;  . COLPOSCOPY  2015   LSIL  . DILATION AND CURETTAGE OF UTERUS  2008  . FINGER FRACTURE SURGERY Right 1983   5th finger    reports that she has never smoked. She has never used smokeless tobacco. She reports that she does not drink alcohol or use drugs. family history includes Heart disease in her father; Hypertension in her father, mother, and paternal grandmother. Allergies  Allergen Reactions  . Erythromycin Nausea And Vomiting    Stomach cramps      Observations/Objective: Patient sounds cheerful and well on the phone. I do not appreciate any SOB. Speech and thought processing are grossly intact. Patient reported vitals:  Assessment and Plan: Restless leg syndrome stable  -Refill Requip for 1 year   Follow Up Instructions:  -Set up follow-up in 1 year and sooner as needed   [id number] 5-10 [id number] 11-20 [id number] 21-30 I did not refer this patient for an OV in the next 24 hours for this/these issue(s).  I discussed the assessment and treatment plan with the patient. The patient was provided an opportunity to ask questions and all were answered. The patient agreed with the plan and demonstrated an understanding of the instructions.   The patient was advised to call back or seek an in-person evaluation if the symptoms worsen or if the condition fails to improve as anticipated.  I provided 11 minutes of non-face-to-face time during this encounter.   Carolann Littler, MD

## 2020-01-20 ENCOUNTER — Encounter: Payer: Self-pay | Admitting: Internal Medicine

## 2020-02-12 ENCOUNTER — Other Ambulatory Visit (HOSPITAL_COMMUNITY): Payer: Self-pay | Admitting: Obstetrics & Gynecology

## 2020-02-12 DIAGNOSIS — Z1231 Encounter for screening mammogram for malignant neoplasm of breast: Secondary | ICD-10-CM

## 2020-03-16 ENCOUNTER — Ambulatory Visit (HOSPITAL_COMMUNITY): Payer: BC Managed Care – PPO

## 2020-03-29 ENCOUNTER — Ambulatory Visit: Payer: BC Managed Care – PPO | Admitting: Internal Medicine

## 2020-03-31 ENCOUNTER — Ambulatory Visit (HOSPITAL_COMMUNITY)
Admission: RE | Admit: 2020-03-31 | Discharge: 2020-03-31 | Disposition: A | Discharge status: Home / Self Care | Payer: BC Managed Care – PPO | Source: Ambulatory Visit | Attending: Obstetrics & Gynecology | Admitting: Obstetrics & Gynecology

## 2020-03-31 ENCOUNTER — Other Ambulatory Visit: Payer: Self-pay

## 2020-03-31 DIAGNOSIS — Z1231 Encounter for screening mammogram for malignant neoplasm of breast: Secondary | ICD-10-CM | POA: Diagnosis present

## 2020-04-01 ENCOUNTER — Encounter: Payer: Self-pay | Admitting: Family Medicine

## 2020-04-01 DIAGNOSIS — R198 Other specified symptoms and signs involving the digestive system and abdomen: Secondary | ICD-10-CM

## 2020-04-22 ENCOUNTER — Ambulatory Visit: Payer: BC Managed Care – PPO | Admitting: Obstetrics & Gynecology

## 2020-05-24 ENCOUNTER — Ambulatory Visit: Payer: BC Managed Care – PPO | Admitting: Internal Medicine

## 2020-08-18 ENCOUNTER — Encounter (HOSPITAL_BASED_OUTPATIENT_CLINIC_OR_DEPARTMENT_OTHER): Payer: Self-pay | Admitting: Obstetrics & Gynecology

## 2020-08-18 ENCOUNTER — Other Ambulatory Visit: Payer: Self-pay

## 2020-08-18 ENCOUNTER — Ambulatory Visit (INDEPENDENT_AMBULATORY_CARE_PROVIDER_SITE_OTHER): Payer: BC Managed Care – PPO | Admitting: Obstetrics & Gynecology

## 2020-08-18 ENCOUNTER — Other Ambulatory Visit (HOSPITAL_BASED_OUTPATIENT_CLINIC_OR_DEPARTMENT_OTHER)
Admission: RE | Admit: 2020-08-18 | Discharge: 2020-08-18 | Disposition: A | Discharge status: Home / Self Care | Payer: BC Managed Care – PPO | Source: Ambulatory Visit | Attending: Obstetrics & Gynecology | Admitting: Obstetrics & Gynecology

## 2020-08-18 ENCOUNTER — Other Ambulatory Visit (HOSPITAL_COMMUNITY)
Admission: RE | Admit: 2020-08-18 | Discharge: 2020-08-18 | Disposition: A | Discharge status: Home / Self Care | Payer: BC Managed Care – PPO | Source: Ambulatory Visit | Attending: Obstetrics & Gynecology | Admitting: Obstetrics & Gynecology

## 2020-08-18 VITALS — BP 138/82 | HR 79 | Ht 63.0 in | Wt 128.0 lb

## 2020-08-18 DIAGNOSIS — Z124 Encounter for screening for malignant neoplasm of cervix: Secondary | ICD-10-CM | POA: Insufficient documentation

## 2020-08-18 DIAGNOSIS — R87619 Unspecified abnormal cytological findings in specimens from cervix uteri: Secondary | ICD-10-CM

## 2020-08-18 DIAGNOSIS — Z0000 Encounter for general adult medical examination without abnormal findings: Secondary | ICD-10-CM | POA: Diagnosis not present

## 2020-08-18 DIAGNOSIS — Z01419 Encounter for gynecological examination (general) (routine) without abnormal findings: Secondary | ICD-10-CM

## 2020-08-18 DIAGNOSIS — M8580 Other specified disorders of bone density and structure, unspecified site: Secondary | ICD-10-CM

## 2020-08-18 DIAGNOSIS — Z634 Disappearance and death of family member: Secondary | ICD-10-CM

## 2020-08-18 DIAGNOSIS — N870 Mild cervical dysplasia: Secondary | ICD-10-CM

## 2020-08-18 LAB — COMPREHENSIVE METABOLIC PANEL
ALT: 18 U/L (ref 0–44)
AST: 18 U/L (ref 15–41)
Albumin: 4.4 g/dL (ref 3.5–5.0)
Alkaline Phosphatase: 75 U/L (ref 38–126)
Anion gap: 6 (ref 5–15)
BUN: 12 mg/dL (ref 6–20)
CO2: 30 mmol/L (ref 22–32)
Calcium: 9.5 mg/dL (ref 8.9–10.3)
Chloride: 103 mmol/L (ref 98–111)
Creatinine, Ser: 0.71 mg/dL (ref 0.44–1.00)
GFR, Estimated: >60 mL/min (ref >60)
Glucose, Bld: 80 mg/dL (ref 70–99)
Potassium: 4.5 mmol/L (ref 3.5–5.1)
Sodium: 139 mmol/L (ref 135–145)
Total Bilirubin: 0.5 mg/dL (ref 0.3–1.2)
Total Protein: 7.2 g/dL (ref 6.5–8.1)

## 2020-08-18 LAB — LIPID PANEL
Cholesterol: 207 mg/dL — ABNORMAL HIGH (ref 0–200)
HDL: 65 mg/dL (ref >40)
LDL Cholesterol: 131 mg/dL — ABNORMAL HIGH (ref 0–99)
Total CHOL/HDL Ratio: 3.2 RATIO
Triglycerides: 57 mg/dL (ref <150)
VLDL: 11 mg/dL (ref 0–40)

## 2020-08-18 LAB — CBC
HCT: 40.1 % (ref 36.0–46.0)
Hemoglobin: 14 g/dL (ref 12.0–15.0)
MCH: 30.6 pg (ref 26.0–34.0)
MCHC: 34.9 g/dL (ref 30.0–36.0)
MCV: 87.7 fL (ref 80.0–100.0)
Platelets: 249 10*3/uL (ref 150–400)
RBC: 4.57 MIL/uL (ref 3.87–5.11)
RDW: 12.3 % (ref 11.5–15.5)
WBC: 4.6 10*3/uL (ref 4.0–10.5)
nRBC: 0 % (ref 0.0–0.2)

## 2020-08-18 LAB — TSH: TSH: 2.061 u[IU]/mL (ref 0.350–4.500)

## 2020-08-18 NOTE — Progress Notes (Addendum)
60 y.o. G0P0000 Single White or Caucasian female here for annual exam.  Mother passed 06/22/2020.  She'd been doing downhill for months.  She has primary biliary cirrhosis.  Denies vaginal bleeding.     Patient's last menstrual period was 12/10/2011 (approximate).          Sexually active: No.  The current method of family planning is post menopausal status.    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 She has never used smokeless tobacco. She reports that she does not drink alcohol and does not use drugs.  Past Medical History:  Diagnosis Date   Abnormal Pap smear of cervix 2008   Allergy    Atypical glandular cells of undetermined significance (AGUS) on cervical Pap smear 2008   TWICE, D&C negative   Chicken pox    Chronic sinusitis    Osteoarthritis     Past Surgical History:  Procedure Laterality Date   ABDOMINAL EXPOSURE N/A 09/02/2018   Procedure: ABDOMINAL EXPOSURE;  Surgeon: Serafina Mitchell, MD;  Location: [facility];  Service: Vascular;  Laterality: N/A;   ANTERIOR LUMBAR FUSION N/A 09/02/2018   Procedure: Lumbar five Sacral one  Anterior lumbar interbody fusion;  Surgeon: Erline Levine, MD;  Location: [facility];  Service: Neurosurgery;  Laterality: N/A;   COLPOSCOPY  2015   LSIL   DILATION AND CURETTAGE OF UTERUS  2008   FINGER FRACTURE SURGERY Right 1983   5th finger    Current Outpatient Medications  Medication Sig Dispense Refill   amitriptyline (ELAVIL) 10 MG tablet Take 1 tablet (10 mg total) by mouth at bedtime as needed for sleep. (Patient taking differently: Take 2.5 mg by mouth at  bedtime as needed for sleep.) 30 tablet 2   methocarbamol (ROBAXIN) 500 MG tablet      Misc Natural Products (OSTEO BI-FLEX TRIPLE STRENGTH) TABS Take 1 tablet by mouth daily.     Multiple Vitamin (MULTIVITAMIN WITH MINERALS) TABS tablet Take 1 tablet by mouth daily.     rOPINIRole (REQUIP) 0.5 MG tablet Take 1 tablet (0.5 mg total) by mouth at bedtime as needed (restless leg). 90 tablet 3   Polyethyl Glycol-Propyl Glycol (SYSTANE OP) Place 1 drop into both eyes 2 (two) times daily as needed (dry eyes). (Patient not taking: Reported on 08/18/2020)     No current facility-administered medications for this visit.    Family History  Problem Relation Age of Onset   Hypertension Mother    Heart disease Father    Hypertension Father    Hypertension Paternal Grandmother    Colon cancer Neg Hx     Review of Systems  All other systems reviewed and are negative.   Exam:   BP 138/82    Pulse 79    Ht 5\' 3"  (1.6 m)    Wt 128 lb (58.1 kg)    LMP 12/10/2011 (Approximate)    BMI 22.67 kg/m   Height: 5\' 3"  (160 cm)  General appearance: alert, cooperative and appears stated age Head: Normocephalic, without obvious abnormality, atraumatic Neck: no adenopathy, supple, symmetrical, trachea midline  and thyroid normal to inspection and palpation Lungs: clear to auscultation bilaterally Breasts: normal appearance, no masses or tenderness Heart: regular rate and rhythm Abdomen: soft, non-tender; bowel sounds normal; no masses,  no organomegaly Extremities: extremities normal, atraumatic, no cyanosis or edema Skin: Skin color, texture, turgor normal. No rashes or lesions Lymph nodes: Cervical, supraclavicular, and axillary nodes normal. No abnormal inguinal nodes palpated Neurologic: Grossly normal   Pelvic: External genitalia:  no lesions              Urethra:  normal appearing urethra with no masses, tenderness or lesions              Bartholins and Skenes: normal                  Vagina: normal appearing vagina with normal color and discharge, no lesions              Cervix: no lesions              Pap taken: Yes.   Bimanual Exam:  Uterus:  normal size, contour, position, consistency, mobility, non-tender              Adnexa: normal adnexa and no mass, fullness, tenderness               Rectovaginal: Confirms               Anus:  normal sphincter tone, no lesions  Chaperone, Prince Rome, CMA, was present for exam.  Assessment/Plan: 1. Well woman exam with routine gynecological exam - pap and HR HPV obtained today - MMG 03/31/2020 - Colonoscopy 2015, follow up 10 years - BMD 12/2015, T score -1.6 - labs will be done today - Vaccines reviewed:  Tdap due.  Do not have in stock to give to pt.  Precautions given.  She will plan at her local pharmacy.  2. Osteopenia, unspecified location - DG DXA FRACTURE ASSESSMENT; Future  3. Blood tests for routine general physical examination - CBC; Future - Comprehensive metabolic panel; Future - Lipid panel; Future - TSH; Future  4. Death of family member (mother) - coping well.  Has support.  5.  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

## 2020-08-23 LAB — CYTOLOGY - PAP
Comment: NEGATIVE
Diagnosis: NEGATIVE
High risk HPV: NEGATIVE

## 2021-01-20 ENCOUNTER — Other Ambulatory Visit: Payer: Self-pay | Admitting: Neurosurgery

## 2021-01-20 ENCOUNTER — Other Ambulatory Visit (HOSPITAL_COMMUNITY): Payer: Self-pay | Admitting: Neurosurgery

## 2021-01-20 DIAGNOSIS — M5416 Radiculopathy, lumbar region: Secondary | ICD-10-CM

## 2021-02-01 ENCOUNTER — Ambulatory Visit (HOSPITAL_COMMUNITY)
Admission: RE | Admit: 2021-02-01 | Discharge: 2021-02-01 | Disposition: A | Discharge status: Home / Self Care | Payer: BC Managed Care – PPO | Source: Ambulatory Visit | Attending: Neurosurgery | Admitting: Neurosurgery

## 2021-02-01 ENCOUNTER — Other Ambulatory Visit: Payer: Self-pay

## 2021-02-01 DIAGNOSIS — M5416 Radiculopathy, lumbar region: Secondary | ICD-10-CM | POA: Insufficient documentation

## 2021-02-01 MED ORDER — GADOBUTROL 1 MMOL/ML IV SOLN
6.0000 mL | Freq: Once | INTRAVENOUS | Status: AC | PRN
  Administered 2021-02-01: 6 mL via INTRAVENOUS

## 2021-02-28 ENCOUNTER — Encounter (HOSPITAL_BASED_OUTPATIENT_CLINIC_OR_DEPARTMENT_OTHER): Payer: Self-pay

## 2021-03-06 ENCOUNTER — Other Ambulatory Visit (HOSPITAL_COMMUNITY): Payer: Self-pay | Admitting: Family Medicine

## 2021-03-06 ENCOUNTER — Other Ambulatory Visit (HOSPITAL_COMMUNITY): Payer: Self-pay | Admitting: Obstetrics & Gynecology

## 2021-03-06 ENCOUNTER — Other Ambulatory Visit (HOSPITAL_COMMUNITY): Payer: Self-pay | Admitting: Physical Medicine and Rehabilitation

## 2021-03-06 DIAGNOSIS — Z1231 Encounter for screening mammogram for malignant neoplasm of breast: Secondary | ICD-10-CM

## 2021-03-29 ENCOUNTER — Other Ambulatory Visit: Payer: Self-pay

## 2021-03-29 ENCOUNTER — Encounter (HOSPITAL_COMMUNITY): Payer: Self-pay | Admitting: *Deleted

## 2021-03-29 ENCOUNTER — Emergency Department (HOSPITAL_COMMUNITY)
Admission: EM | Admit: 2021-03-29 | Discharge: 2021-03-29 | Disposition: A | Discharge status: Home / Self Care | Payer: BC Managed Care – PPO | Attending: Emergency Medicine | Admitting: Emergency Medicine

## 2021-03-29 DIAGNOSIS — R55 Syncope and collapse: Secondary | ICD-10-CM | POA: Insufficient documentation

## 2021-03-29 DIAGNOSIS — I493 Ventricular premature depolarization: Secondary | ICD-10-CM | POA: Diagnosis not present

## 2021-03-29 DIAGNOSIS — R42 Dizziness and giddiness: Secondary | ICD-10-CM | POA: Insufficient documentation

## 2021-03-29 NOTE — ED Notes (Signed)
Patient denies any dizziness upon sitting or standing.

## 2021-03-29 NOTE — Discharge Instructions (Addendum)
It is possible that your reaction today was from this new medication that you took yesterday, however as discussed there are other reasons for passing out including heart problems such as an arrhythmia or dehydration.  Please return if you have any return of symptoms or any new symptoms develop.    I recommend avoiding the Mucinex DM until this is sorted out.  I have referred you to a cardiologist for further evaluation of a possible cardiac source for today's event.  Please call for an office visit.

## 2021-03-29 NOTE — ED Triage Notes (Signed)
Pt was waiting in the short stay/endoscopy waiting area for her father to have a colonoscopy when she started to feel dizzy and hot while siting down. She yelled for help and someone came over to assist her. She reports she doesn't think she fell because someone came to help her, but she also doesn't remember being brought over to the ED. Pt denies any complaints at this time.

## 2021-03-29 NOTE — ED Provider Notes (Signed)
[facility] EMERGENCY DEPARTMENT Provider Note   CSN: [id number] Arrival date & time: 03/29/21  1024     History Chief Complaint  Patient presents with   Loss of Consciousness    Dana Cook is a 60 y.o. female with a history as outlined below, currently having some sinus issues so started taking Mucinex DM yesterday.  This morning she was sitting in the Endo suite waiting for her father who is undergoing an endoscopy procedure when she started feeling lightheaded, hot and had a syncopal event.  She was sitting in a chair and somebody was near her when this happened, she did not fall out of the chair.  She guesstimates this episode lasted for less than 2 minutes and she currently feels back to her normal baseline.  She denies any complaints of pain, headache, denies having any chest pain, palpitations, nausea or vomiting before or since the event.  She strongly feels it is a side effect of this Mucinex DM which she just took for the first time yesterday.  It is noted that her blood pressure is low currently at 108/70.  Patient states this is actually a high blood pressure for her as her systolic blood pressure is quite often in the 90s.  Review of chart confirms her blood pressures frequently in the 90s to low 100 range.  Patient feels back to her baseline and is not desirous of any work-up here.  She has agreed to at least a set of orthostatic vital signs.  She denies any reason to be dehydrated, stating she drinks plenty of fluids.    The history is provided by the patient.      Past Medical History:  Diagnosis Date   Abnormal Pap smear of cervix 2008   Allergy    Atypical glandular cells of undetermined significance (AGUS) on cervical Pap smear 2008   TWICE, D&C negative   Chicken pox    Chronic sinusitis    Osteoarthritis     Patient Active Problem List   Diagnosis Date Noted   Dysplasia of cervix, low grade (CIN 1) 08/18/2020   Lumbar scoliosis 09/02/2018    Epigastric pain 12/10/2017   Chronic midline low back pain without sciatica 10/31/2016   RLS (restless legs syndrome) 12/05/2015   Contraception management 10/07/2013    Past Surgical History:  Procedure Laterality Date   ABDOMINAL EXPOSURE N/A 09/02/2018   Procedure: ABDOMINAL EXPOSURE;  Surgeon: Serafina Mitchell, MD;  Location: [place];  Service: Vascular;  Laterality: N/A;   ANTERIOR LUMBAR FUSION N/A 09/02/2018   Procedure: Lumbar five Sacral one  Anterior lumbar interbody fusion;  Surgeon: Erline Levine, MD;  Location: [place];  Service: Neurosurgery;  Laterality: N/A;   COLPOSCOPY  2015   LSIL   DILATION AND CURETTAGE OF UTERUS  2008   FINGER FRACTURE SURGERY Right 1983   5th finger     OB History     Gravida  0   Para  0   Term  0   Preterm  0   AB  0   Living  0      SAB  0   IAB  0   Ectopic  0   Multiple  0   Live Births              Family History  Problem Relation Age of Onset   Hypertension Mother    Heart disease Father    Hypertension Father    Hypertension Paternal Grandmother  Colon cancer Neg Hx     Social History   Tobacco Use   Smoking status: Never   Smokeless tobacco: Never  Vaping Use   Vaping Use: Never used  Substance Use Topics   Alcohol use: No   Drug use: No    Home Medications Prior to Admission medications   Medication Sig Start Date End Date Taking? Authorizing Provider  amitriptyline (ELAVIL) 10 MG tablet Take 1 tablet (10 mg total) by mouth at bedtime as needed for sleep. Patient taking differently: Take 2.5 mg by mouth at bedtime as needed for sleep. 11/01/17   Megan Salon, MD  methocarbamol (ROBAXIN) 500 MG tablet  09/03/18   [provider]  Misc Natural Products (OSTEO BI-FLEX TRIPLE STRENGTH) TABS Take 1 tablet by mouth daily.    [provider]  Multiple Vitamin (MULTIVITAMIN WITH MINERALS) TABS tablet Take 1 tablet by mouth daily.    [provider]  Polyethyl Glycol-Propyl  Glycol (SYSTANE OP) Place 1 drop into both eyes 2 (two) times daily as needed (dry eyes). Patient not taking: Reported on 08/18/2020    [provider]  rOPINIRole (REQUIP) 0.5 MG tablet Take 1 tablet (0.5 mg total) by mouth at bedtime as needed (restless leg). 06/09/19   Burchette, Alinda Sierras, MD    Allergies    Erythromycin  Review of Systems   Review of Systems  Constitutional:  Negative for fever.  HENT:  Negative for congestion and sore throat.   Eyes: Negative.   Respiratory:  Negative for chest tightness and shortness of breath.   Cardiovascular:  Negative for chest pain.  Gastrointestinal:  Negative for abdominal pain and nausea.  Genitourinary: Negative.   Musculoskeletal:  Negative for arthralgias, joint swelling and neck pain.  Skin: Negative.  Negative for rash and wound.  Neurological:  Positive for syncope. Negative for dizziness, weakness, light-headedness, numbness and headaches.  Psychiatric/Behavioral: Negative.    All other systems reviewed and are negative.  Physical Exam Updated Vital Signs BP 100/72   Pulse 97   Temp 99.5 F (37.5 C) (Oral)   Resp (!) 26   Ht 5\' 4"  (1.626 m)   Wt 54.9 kg   LMP 12/10/2011 (Approximate)   SpO2 99%   BMI 20.77 kg/m   Physical Exam Vitals and nursing note reviewed.  Constitutional:      Appearance: Normal appearance. She is well-developed.  HENT:     Head: Normocephalic and atraumatic.  Eyes:     Conjunctiva/sclera: Conjunctivae normal.  Cardiovascular:     Rate and Rhythm: Regular rhythm. Tachycardia present.     Heart sounds: Normal heart sounds.     Comments: Borderline tachy Pulmonary:     Effort: Pulmonary effort is normal.     Breath sounds: No wheezing.  Abdominal:     General: Abdomen is flat.     Palpations: Abdomen is soft.  Musculoskeletal:        General: Normal range of motion.  Skin:    General: Skin is warm and dry.  Neurological:     General: No focal deficit present.     Mental  Status: She is alert.    ED Results / Procedures / Treatments   Labs (all labs ordered are listed, but only abnormal results are displayed) Labs Reviewed - No data to display  EKG EKG Interpretation  Date/Time:  Wednesday March 29 2021 10:41:15 EDT Ventricular Rate:  104 PR Interval:  122 QRS Duration: 99 QT Interval:  304 QTC  Calculation: 400 R Axis:   15 Text Interpretation: Sinus tachycardia Multiple ventricular premature complexes Confirmed by Milton Ferguson [phone]) on 03/29/2021 11:12:33 AM  Radiology No results found.  Procedures Procedures   Medications Ordered in ED Medications - No data to display  ED Course  I have reviewed the triage vital signs and the nursing notes.  Pertinent labs & imaging results that were available during my care of the patient were reviewed by me and considered in my medical decision making (see chart for details).    MDM Rules/Calculators/A&P                           Patient with an episode of syncope now feeling 100% well.  She refuses any blood work.  She believes her symptoms were secondary to a new medication Mucinex DM.  Reviewing side effects of this medication, does list dizziness, not specifically syncope or hypotension.  She was advised she may return at any point if she has return of symptoms.  Discussed list of potential reasons for syncopal events including cardiac arrhythmias.  Discussed pvc's on her ekg which can be a common and asymptomatic finding for many people, but cannot prove today that this was symptomatic for her when she had this episode.  Recommended close f/u with cardiology, may want to consider cardiac monitor - referred to Dr. Domenic Polite.   Patient is motivated to be discharged at this time. Final Clinical Impression(s) / ED Diagnoses Final diagnoses:  Syncope, unspecified syncope type  Premature ventricular contractions (PVCs) (VPCs)    Rx / DC Orders ED Discharge Orders     None        Landis Martins 03/29/21 [place]    Milton Ferguson, MD 04/01/21 1029

## 2021-03-30 IMAGING — MG DIGITAL SCREENING BILATERAL MAMMOGRAM WITH TOMO AND CAD
8 series · 9 of 24 positions shown · non-contrast
Comparison: Previous exam(s).

CLINICAL DATA: Screening.

EXAM:
DIGITAL SCREENING BILATERAL MAMMOGRAM WITH TOMO AND CAD

[R MLO synth-2D]
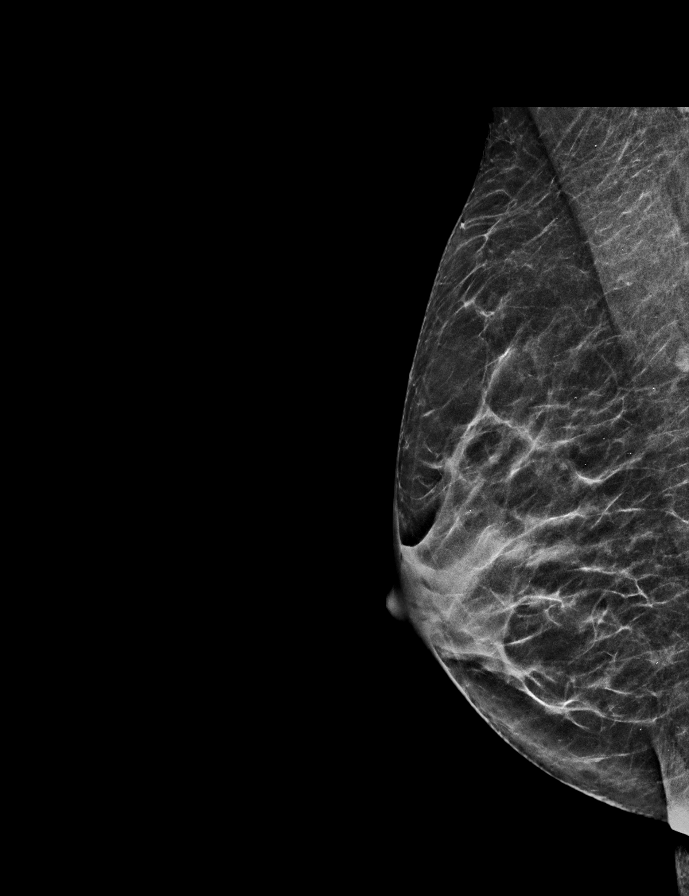

[L CC synth-2D]
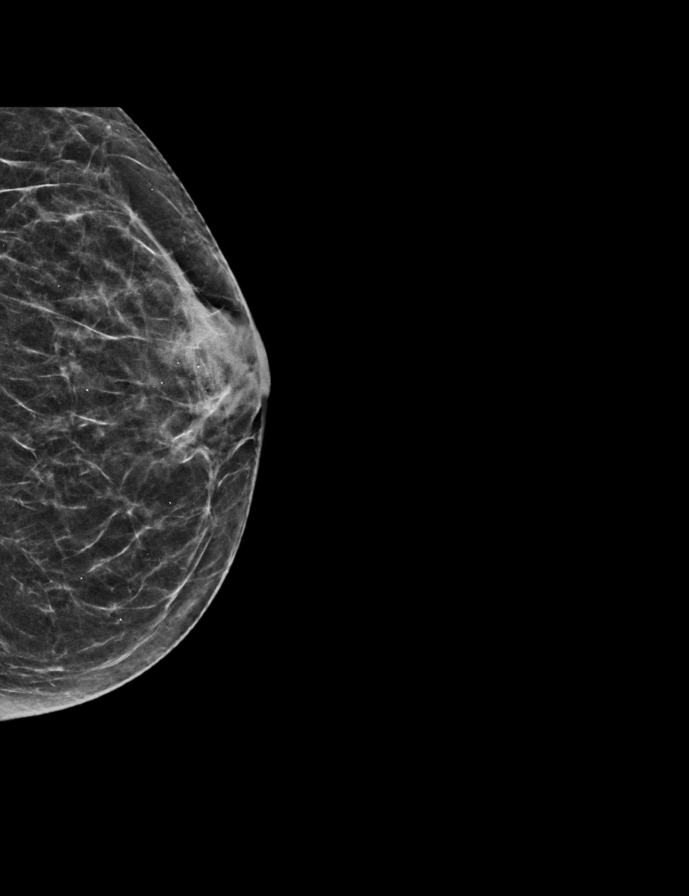

[R CC synth-2D]
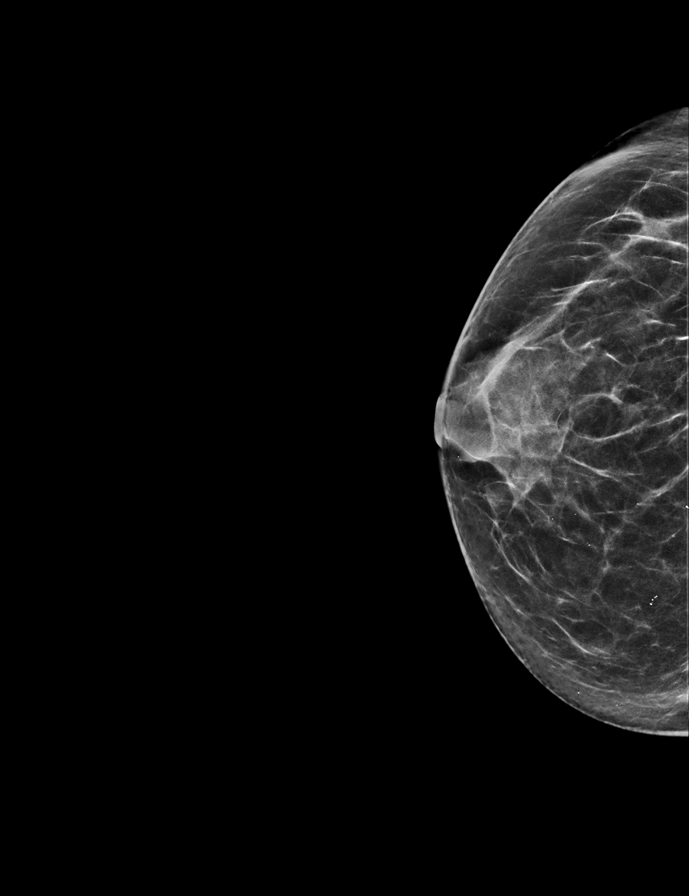

[L MLO synth-2D]
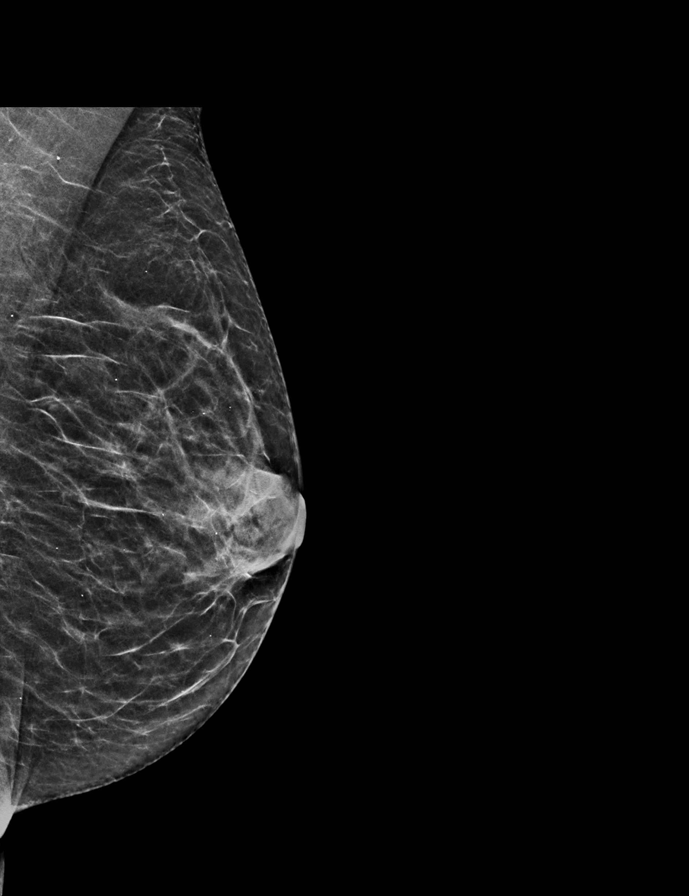

[L MLO tomo · 2 of 45 frames shown]
[frame 15/45]
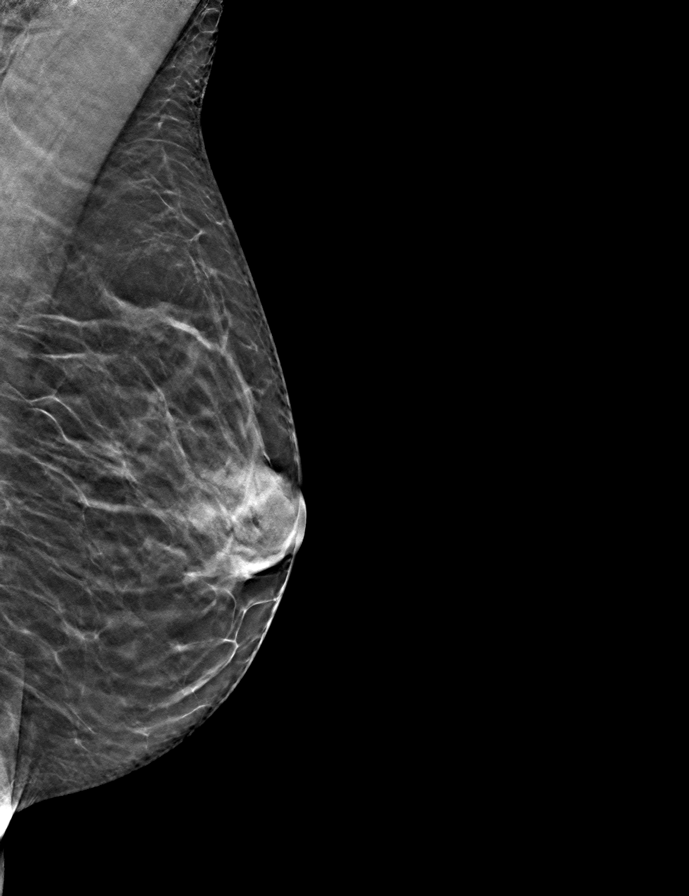
[frame 23/45]
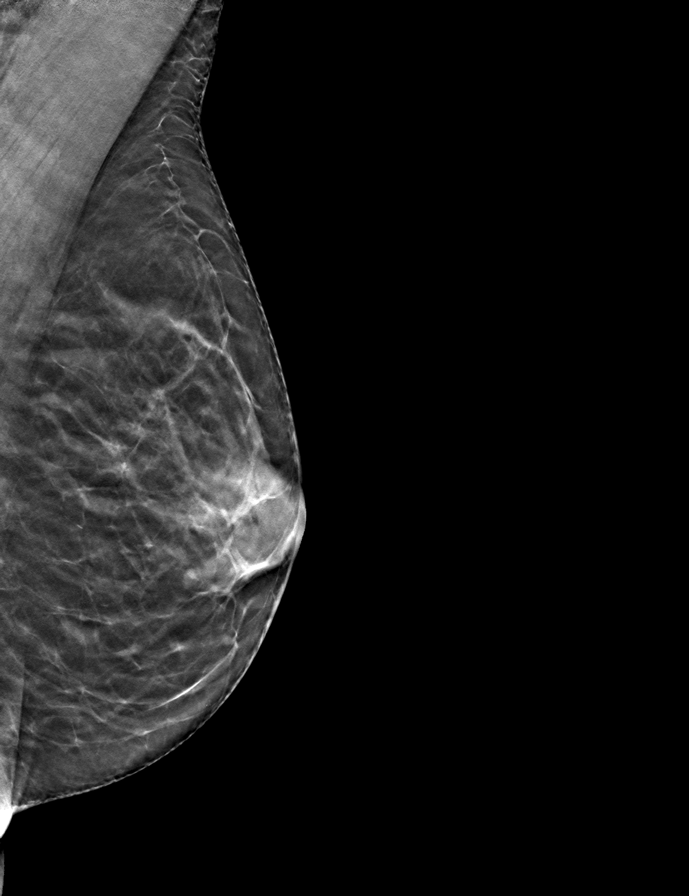

[R CC tomo · tomo slice 27/52.0]
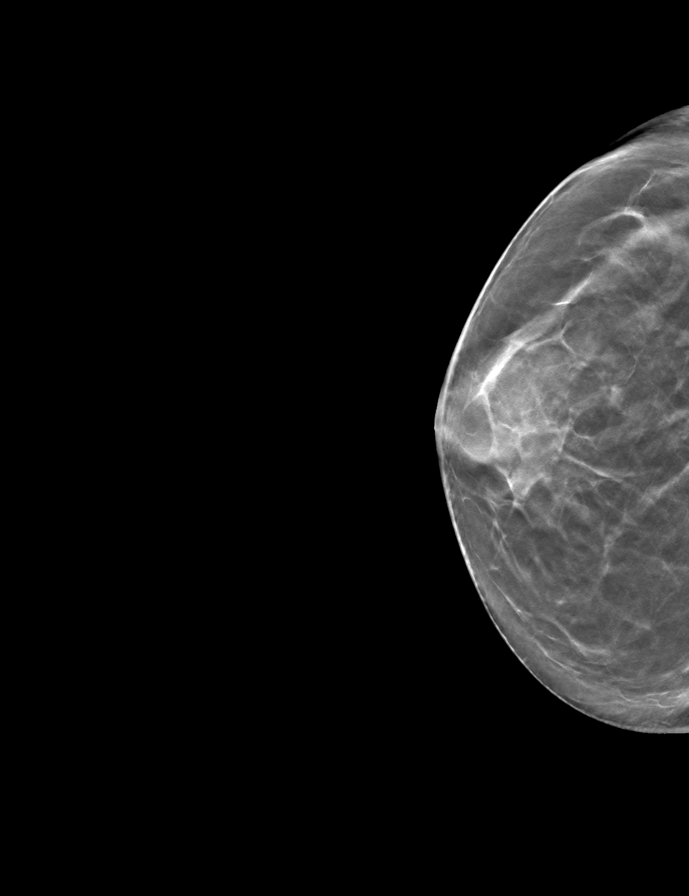

[L CC tomo · tomo slice 24/47.0]
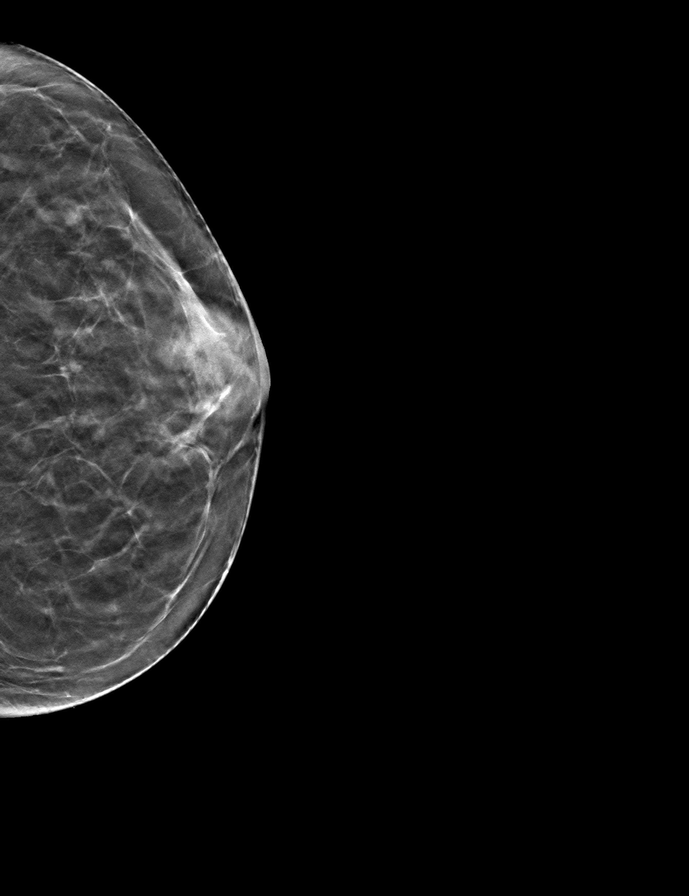

[R MLO tomo · tomo slice 25/49.0]
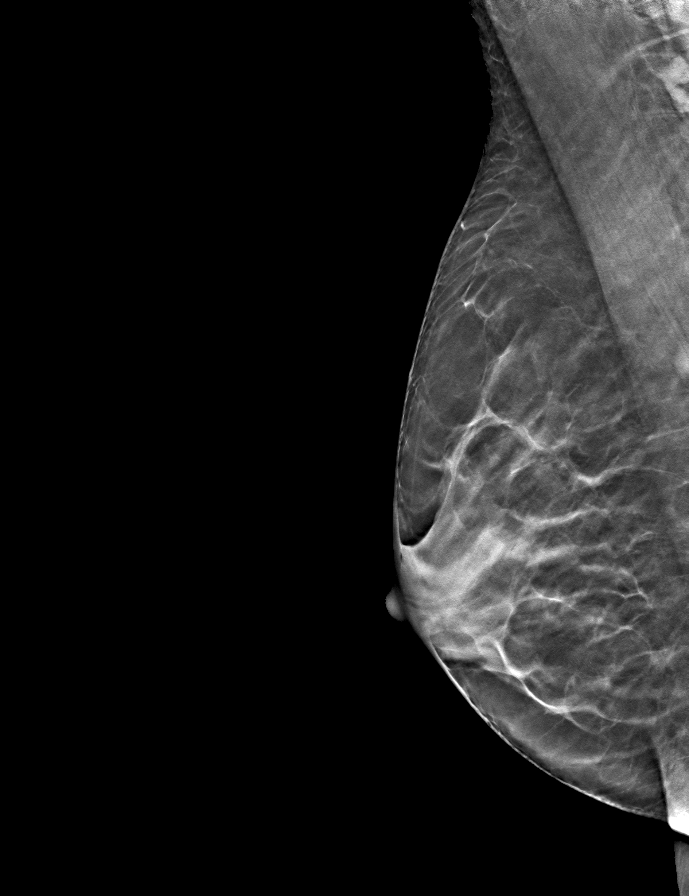

[9 of 24 positions shown; findings below may reference images not displayed]

ACR Breast Density Category c: The breast tissue is heterogeneously
dense, which may obscure small masses.
FINDINGS: In the right breast, a possible asymmetry warrants further
evaluation, seen on MLO slice 22/49 in the inferior and far
posterior breast. In the left breast, no findings suspicious for
malignancy. Images were processed with CAD.
IMPRESSION: Further evaluation is suggested for possible asymmetry in the right
breast.

RECOMMENDATION:
Diagnostic mammogram and possibly ultrasound of the right breast.
(Code:N6-1-EET)

The patient will be contacted regarding the findings, and additional
imaging will be scheduled.

BI-RADS CATEGORY  0: Incomplete. Need additional imaging evaluation
and/or prior mammograms for comparison.

## 2021-04-17 ENCOUNTER — Other Ambulatory Visit: Payer: Self-pay

## 2021-04-17 ENCOUNTER — Ambulatory Visit (HOSPITAL_COMMUNITY)
Admission: RE | Admit: 2021-04-17 | Discharge: 2021-04-17 | Disposition: A | Discharge status: Home / Self Care | Payer: BC Managed Care – PPO | Source: Ambulatory Visit | Attending: Obstetrics & Gynecology | Admitting: Obstetrics & Gynecology

## 2021-04-17 DIAGNOSIS — M8580 Other specified disorders of bone density and structure, unspecified site: Secondary | ICD-10-CM | POA: Insufficient documentation

## 2021-04-17 DIAGNOSIS — Z1231 Encounter for screening mammogram for malignant neoplasm of breast: Secondary | ICD-10-CM | POA: Insufficient documentation

## 2021-05-02 ENCOUNTER — Ambulatory Visit (HOSPITAL_BASED_OUTPATIENT_CLINIC_OR_DEPARTMENT_OTHER): Payer: BC Managed Care – PPO | Admitting: Obstetrics & Gynecology

## 2021-05-02 ENCOUNTER — Encounter (HOSPITAL_BASED_OUTPATIENT_CLINIC_OR_DEPARTMENT_OTHER): Payer: Self-pay | Admitting: Obstetrics & Gynecology

## 2021-05-02 ENCOUNTER — Other Ambulatory Visit: Payer: Self-pay

## 2021-05-02 VITALS — BP 119/76 | HR 84 | Ht 64.0 in | Wt 121.4 lb

## 2021-05-02 DIAGNOSIS — M810 Age-related osteoporosis without current pathological fracture: Secondary | ICD-10-CM

## 2021-05-02 NOTE — Progress Notes (Signed)
Subjective:    21 yrs Single Caucasian G0P0000  female here to discuss recent BMD obtained 04/17/2021 showing osteoporosis with T score -2.7. This was decreased from T score of -1.6 in 2017  Osteoporosis Risk Factors  Nonmodifiable Personal Hx of fracture as an adult: no Hx of fracture in first-degree relative: no but mother and MGM have/had osteoporosis Caucasian race: no Dementia: no Poor health/frailty: no   Potentially modifiable: Tobacco use: no Low body weight:  yes Estrogen deficiency with either early menopause (age <45) or bilateral ovariectomy: no Low calcium intake (lifelong): yes Alcohol use more than 2 drinks per day: no Recurrent falls: no Inadequate physical activity: yes. She is active but not do regular exercise  Current calcium and Vit D intake: in multivitamin but does not know exact dosage  Past Medical History:  Diagnosis Date   Abnormal Pap smear of cervix 2008   Allergy    Atypical glandular cells of undetermined significance (AGUS) on cervical Pap smear 2008   TWICE, D&C negative   Chicken pox    Chronic sinusitis    Osteoarthritis    Current Outpatient Medications on File Prior to Visit  Medication Sig Dispense Refill   amitriptyline (ELAVIL) 10 MG tablet Take 1 tablet (10 mg total) by mouth at bedtime as needed for sleep. (Patient taking differently: Take 2.5 mg by mouth at bedtime as needed for sleep.) 30 tablet 2   Misc Natural Products (OSTEO BI-FLEX TRIPLE STRENGTH) TABS Take 1 tablet by mouth daily.     Multiple Vitamin (MULTIVITAMIN WITH MINERALS) TABS tablet Take 1 tablet by mouth daily.     Polyethyl Glycol-Propyl Glycol (SYSTANE OP) Place 1 drop into both eyes 2 (two) times daily as needed (dry eyes).     rOPINIRole (REQUIP) 0.5 MG tablet Take 1 tablet (0.5 mg total) by mouth at bedtime as needed (restless leg). 90 tablet 3   methocarbamol (ROBAXIN) 500 MG tablet      No current facility-administered medications on file prior to visit.    Allergies  Allergen Reactions   Erythromycin Nausea And Vomiting    Stomach cramps    Review of Systems Pertinent items noted in HPI and remainder of comprehensive ROS otherwise negative.     Objective:   PHYSICAL EXAM BP 119/76 (BP Location: Right Arm, Patient Position: Sitting, Cuff Size: Normal)   Pulse 84   Ht 5\' 4"  (1.626 m) Comment: reported  Wt 121 lb 6.4 oz (55.1 kg)   LMP 12/10/2011 (Approximate)   BMI 20.84 kg/m  General appearance: alert, cooperative, and no distress  Imaging Bone Density: Spine T Score: not measured, Hip T Score: -2.7   Done on  FRAX score:  not calculated                                       Assessment:   Osteoporosis with T score -2.7   Plan:   1.  Patient counseled in adequate calcium and vitamin D exposure.  Calcium - 500 - 1000 mg elemental calcium/day in divided doses  Vitamin D - 800 IU/day  Told not to take at same time as bisphosphonate. 2.  Exercise recommended at least 30 minutes 3 times per week.  3.  Recommendation to avoid heavy ETOH use.  4.  Counseled to avoid tobacco and second had smoke. 5.  Fall prevention discussed. 6.  Pharmacologic therapy therapy below discussed including risks  and benefits: bisphosphonates 7.  Repeat bone density in 2 years.  Total time with pt: 23 minutes

## 2021-05-03 ENCOUNTER — Institutional Professional Consult (permissible substitution) (HOSPITAL_BASED_OUTPATIENT_CLINIC_OR_DEPARTMENT_OTHER): Payer: BC Managed Care – PPO | Admitting: Obstetrics & Gynecology

## 2021-05-03 LAB — COMPREHENSIVE METABOLIC PANEL
ALT: 23 IU/L (ref 0–32)
AST: 24 IU/L (ref 0–40)
Albumin/Globulin Ratio: 2.2 (ref 1.2–2.2)
Albumin: 4.3 g/dL (ref 3.8–4.9)
Alkaline Phosphatase: 93 IU/L (ref 44–121)
BUN/Creatinine Ratio: 17 (ref 9–23)
BUN: 13 mg/dL (ref 6–24)
Bilirubin Total: 0.2 mg/dL (ref 0.0–1.2)
CO2: 26 mmol/L (ref 20–29)
Calcium: 9.2 mg/dL (ref 8.7–10.2)
Chloride: 101 mmol/L (ref 96–106)
Creatinine, Ser: 0.75 mg/dL (ref 0.57–1.00)
Globulin, Total: 2 g/dL (ref 1.5–4.5)
Glucose: 62 mg/dL — ABNORMAL LOW (ref 70–99)
Potassium: 4.7 mmol/L (ref 3.5–5.2)
Sodium: 141 mmol/L (ref 134–144)
Total Protein: 6.3 g/dL (ref 6.0–8.5)
eGFR: 92 mL/min/{1.73_m2} (ref >59)

## 2021-05-03 LAB — MAGNESIUM: Magnesium: 1.9 mg/dL (ref 1.6–2.3)

## 2021-05-03 LAB — PARATHYROID HORMONE, INTACT (NO CA): PTH: 17 pg/mL (ref 15–65)

## 2021-05-03 LAB — VITAMIN D 25 HYDROXY (VIT D DEFICIENCY, FRACTURES): Vit D, 25-Hydroxy: 26.9 ng/mL — ABNORMAL LOW (ref 30.0–100.0)

## 2021-05-03 LAB — PHOSPHORUS: Phosphorus: 3.7 mg/dL (ref 3.0–4.3)

## 2021-05-03 LAB — TSH: TSH: 1.48 u[IU]/mL (ref 0.450–4.500)

## 2021-05-10 ENCOUNTER — Encounter: Payer: Self-pay | Admitting: Orthopaedic Surgery

## 2021-05-10 ENCOUNTER — Other Ambulatory Visit: Payer: Self-pay

## 2021-05-10 ENCOUNTER — Ambulatory Visit (INDEPENDENT_AMBULATORY_CARE_PROVIDER_SITE_OTHER): Payer: BC Managed Care – PPO | Admitting: Orthopaedic Surgery

## 2021-05-10 ENCOUNTER — Ambulatory Visit: Payer: Self-pay

## 2021-05-10 VITALS — Ht 64.0 in | Wt 119.4 lb

## 2021-05-10 DIAGNOSIS — M898X8 Other specified disorders of bone, other site: Secondary | ICD-10-CM

## 2021-05-10 DIAGNOSIS — R0789 Other chest pain: Secondary | ICD-10-CM | POA: Diagnosis not present

## 2021-05-10 NOTE — Progress Notes (Signed)
Office Visit Note   Patient: Dana Cook           Date of Birth: [birth year]           MRN: [id number] Visit Date: 05/10/2021              Requested by: Eulas Post, MD [place],  [place] [id number] PCP: Eulas Post, MD   Assessment & Plan: Visit Diagnoses:  1. Sternal pain   2. Sternal mass     Plan: Dana Cook noted a painless lump along the proximal right side of her sternum several weeks ago.  She is not aware that it has enlarged nor has it been the least bit tender.  X-rays demonstrate a small cystic area within the rib attachment at the sternum.  I do not think it is anything more than that and would suggest just monitoring it.  If it becomes tender or should it enlarge I think it is worth obtaining a CT scan.  She will let me know.  Follow-Up Instructions: Return if symptoms worsen or fail to improve.   Orders:  Orders Placed This Encounter  Procedures   XR Sternum   No orders of the defined types were placed in this encounter.     Procedures: No procedures performed   Clinical Data: No additional findings.   Subjective: Chief Complaint  Patient presents with   knot right anterior chest  Patient presents with "knot" right anterior chest that she noticed about a month ago.  It is not painful and does not bother her at all. She denies any shortness of breath associated with it.  No history of injury or trauma.  HPI  Review of Systems   Objective: Vital Signs: Ht 5\' 4"  (1.626 m)   Wt 119 lb 6.4 oz (54.2 kg)   LMP 12/10/2011 (Approximate)   BMI 20.49 kg/m   Physical Exam Constitutional:      Appearance: She is well-developed.  Pulmonary:     Effort: Pulmonary effort is normal.  Skin:    General: Skin is warm and dry.  Neurological:     Mental Status: She is alert and oriented to person, place, and time.  Psychiatric:        Behavior: Behavior normal.    Ortho Exam nontender prominence at the rib  sternal articulation on the right side proximal sternum.  No sternal pain.  Prominence is inferior to the sternal clavicular joint no pain range of motion of cervical spine or right shoulder.  Specialty Comments:  No specialty comments available.  Imaging: XR Sternum  Result Date: 05/10/2021 Patient noted an enlargement on the right side of her proximal sternum near the rib sternal attachment 2 weeks ago.  X-rays demonstrate some prominence of the rib as it attaches to the sternum with possible cyst formation.  Completely asymptomatic.  Findings are consistent with enlargement possibly the third or fourth rib near its attachment to the anterior right sternum    PMFS History: Patient Active Problem List   Diagnosis Date Noted   Sternal mass 05/10/2021   Dysplasia of cervix, low grade (CIN 1) 08/18/2020   Lumbar scoliosis 09/02/2018   Epigastric pain 12/10/2017   Chronic midline low back pain without sciatica 10/31/2016   RLS (restless legs syndrome) 12/05/2015   Contraception management 10/07/2013   Past Medical History:  Diagnosis Date   Abnormal Pap smear of cervix 2008   Allergy    Atypical glandular cells of  undetermined significance (AGUS) on cervical Pap smear 2008   TWICE, D&C negative   Chicken pox    Chronic sinusitis    Osteoarthritis     Family History  Problem Relation Age of Onset   Hypertension Mother    Heart disease Father    Hypertension Father    Hypertension Paternal Grandmother    Colon cancer Neg Hx     Past Surgical History:  Procedure Laterality Date   ABDOMINAL EXPOSURE N/A 09/02/2018   Procedure: ABDOMINAL EXPOSURE;  Surgeon: Serafina Mitchell, MD;  Location: [place] [facility];  Service: Vascular;  Laterality: N/A;   ANTERIOR LUMBAR FUSION N/A 09/02/2018   Procedure: Lumbar five Sacral one  Anterior lumbar interbody fusion;  Surgeon: Erline Levine, MD;  Location: [facility];  Service: Neurosurgery;  Laterality: N/A;   COLPOSCOPY  2015   LSIL   DILATION AND  CURETTAGE OF UTERUS  2008   FINGER FRACTURE SURGERY Right 1983   5th finger   Social History   Occupational History   Not on file  Tobacco Use   Smoking status: Never   Smokeless tobacco: Never  Vaping Use   Vaping Use: Never used  Substance and Sexual Activity   Alcohol use: No   Drug use: No   Sexual activity: Not Currently    Partners: Male    Birth control/protection: Post-menopausal

## 2021-08-15 ENCOUNTER — Ambulatory Visit: Payer: BC Managed Care – PPO | Admitting: Cardiology

## 2021-08-28 ENCOUNTER — Ambulatory Visit (INDEPENDENT_AMBULATORY_CARE_PROVIDER_SITE_OTHER): Payer: BC Managed Care – PPO | Admitting: Obstetrics & Gynecology

## 2021-08-28 ENCOUNTER — Encounter (HOSPITAL_BASED_OUTPATIENT_CLINIC_OR_DEPARTMENT_OTHER): Payer: Self-pay | Admitting: Obstetrics & Gynecology

## 2021-08-28 VITALS — BP 115/76 | HR 77 | Ht 63.0 in | Wt 121.6 lb

## 2021-08-28 DIAGNOSIS — Z01419 Encounter for gynecological examination (general) (routine) without abnormal findings: Secondary | ICD-10-CM | POA: Diagnosis not present

## 2021-08-28 DIAGNOSIS — M810 Age-related osteoporosis without current pathological fracture: Secondary | ICD-10-CM

## 2021-08-28 DIAGNOSIS — R87612 Low grade squamous intraepithelial lesion on cytologic smear of cervix (LGSIL): Secondary | ICD-10-CM

## 2021-08-28 DIAGNOSIS — N870 Mild cervical dysplasia: Secondary | ICD-10-CM

## 2021-08-28 DIAGNOSIS — Z780 Asymptomatic menopausal state: Secondary | ICD-10-CM | POA: Diagnosis not present

## 2021-08-28 DIAGNOSIS — Z8742 Personal history of other diseases of the female genital tract: Secondary | ICD-10-CM

## 2021-08-28 NOTE — Progress Notes (Signed)
61 y.o. G0P0000 Single White or Caucasian female here for annual exam.  Doing well.  Has osteoporosis with t score -2.7.  Did not want to be on anything.  Is taking calcium.  Plan to repeat in two years which would be 04/2023.  On calcium. ? ?Patient's last menstrual period was 12/10/2011 (approximate).          ?Sexually active: No.  ?The current method of family planning is post menopausal status.    ?Exercising: walking ?Smoker:  no ? ?Health Maintenance: ?Pap:  08/18/2020 Negative ?History of abnormal Pap:  yes, h/o CIN 1 ?MMG:  04/17/2021 Negative ?Colonoscopy:  10/15/2013 ?BMD:   04/17/2021 Osteoporosis ?Screening Labs: does with new PCP ? ? reports that she has never smoked. She has never used smokeless tobacco. She reports that she does not drink alcohol and does not use drugs. ? ?Past Medical History:  ?Diagnosis Date  ? Abnormal Pap smear of cervix 2008  ? Allergy   ? Atypical glandular cells of undetermined significance (AGUS) on cervical Pap smear 2008  ? TWICE, D&C negative  ? Chicken pox   ? Chronic sinusitis   ? Osteoarthritis   ? ? ?Past Surgical History:  ?Procedure Laterality Date  ? ABDOMINAL EXPOSURE N/A 09/02/2018  ? Procedure: ABDOMINAL EXPOSURE;  Surgeon: Serafina Mitchell, MD;  Location: [facility];  Service: Vascular;  Laterality: N/A;  ? ANTERIOR LUMBAR FUSION N/A 09/02/2018  ? Procedure: Lumbar five Sacral one  Anterior lumbar interbody fusion;  Surgeon: Erline Levine, MD;  Location: [place];  Service: Neurosurgery;  Laterality: N/A;  ? COLPOSCOPY  2015  ? LSIL  ? DILATION AND CURETTAGE OF UTERUS  2008  ? FINGER FRACTURE SURGERY Right 1983  ? 5th finger  ? ? ?Current Outpatient Medications  ?Medication Sig Dispense Refill  ? amitriptyline (ELAVIL) 10 MG tablet Take 1 tablet (10 mg total) by mouth at bedtime as needed for sleep. (Patient taking differently: Take 2.5 mg by mouth at bedtime as needed for sleep.) 30 tablet 2  ? calcium carbonate (OSCAL) 1500 (600 Ca) MG TABS tablet Take by mouth 2 (two)  times daily with a meal.    ? Misc Natural Products (OSTEO BI-FLEX TRIPLE STRENGTH) TABS Take 1 tablet by mouth daily.    ? Multiple Vitamin (MULTIVITAMIN WITH MINERALS) TABS tablet Take 1 tablet by mouth daily.    ? Polyethyl Glycol-Propyl Glycol (SYSTANE OP) Place 1 drop into both eyes 2 (two) times daily as needed (dry eyes).    ? rOPINIRole (REQUIP) 0.5 MG tablet Take 1 tablet (0.5 mg total) by mouth at bedtime as needed (restless leg). 90 tablet 3  ? ?No current facility-administered medications for this visit.  ? ? ?Family History  ?Problem Relation Age of Onset  ? Hypertension Mother   ? Heart disease Father   ? Hypertension Father   ? Hypertension Paternal Grandmother   ? Colon cancer Neg Hx   ? ? ?Review of Systems  ?All other systems reviewed and are negative. ? ?Exam:   ?BP 115/76 (BP Location: Left Arm, Patient Position: Sitting, Cuff Size: Normal)   Pulse 77   Ht '5\' 3"'$  (1.6 m)   Wt 121 lb 9.6 oz (55.2 kg)   LMP 12/10/2011 (Approximate)   BMI 21.54 kg/m?   Height: '5\' 3"'$  (160 cm) ? ?General appearance: alert, cooperative and appears stated age ?Head: Normocephalic, without obvious abnormality, atraumatic ?Neck: no adenopathy, supple, symmetrical, trachea midline and thyroid normal to inspection and palpation ?Lungs:  clear to auscultation bilaterally ?Breasts: normal appearance, no masses or tenderness ?Heart: regular rate and rhythm ?Abdomen: soft, non-tender; bowel sounds normal; no masses,  no organomegaly ?Extremities: extremities normal, atraumatic, no cyanosis or edema ?Skin: Skin color, texture, turgor normal. No rashes or lesions ?Lymph nodes: Cervical, supraclavicular, and axillary nodes normal. ?No abnormal inguinal nodes palpated ?Neurologic: Grossly normal ? ? ?Pelvic: External genitalia:  no lesions ?             Urethra:  normal appearing urethra with no masses, tenderness or lesions ?             Bartholins and Skenes: normal    ?             Vagina: normal appearing vagina with normal  color and no discharge, no lesions ?             Cervix: no lesions ?             Pap taken: No. ?Bimanual Exam:  Uterus:  normal size, contour, position, consistency, mobility, non-tender ?             Adnexa: normal adnexa and no mass, fullness, tenderness ?              Rectovaginal: Confirms ?              Anus:  normal sphincter tone, no lesions ? ?Chaperone, Octaviano Batty, CMA, was present for exam. ? ?Assessment/Plan: ?1. Well woman exam with routine gynecological exam ?- pap with neg HR HPV 2022.  Not indicated today. ?- MMG 04/2021 ?- BMD done 04/2021 ?- colonoscopy 2015.  Follow up 10 years. ?- lab work done with PCP ?- vaccines reviewed/updated ? ?2. Postmenopausal ?- not HRT ? ?3. Age-related osteoporosis without current pathological fracture ?- declined treatment.  Plan to repeat next year ? ?4. H/o abnormal pap smear (LGSIL with +HR HPV 2015) ?- pt has desired a pap smear almost every year since then and they have all be negative with multiple negative HR HPV tests.  D/w pt today and we both feel comfortable not obtained pap this year. ? ? ?

## 2021-08-30 ENCOUNTER — Encounter (HOSPITAL_BASED_OUTPATIENT_CLINIC_OR_DEPARTMENT_OTHER): Payer: Self-pay | Admitting: Obstetrics & Gynecology

## 2021-08-30 DIAGNOSIS — Z8742 Personal history of other diseases of the female genital tract: Secondary | ICD-10-CM | POA: Insufficient documentation

## 2021-12-07 ENCOUNTER — Encounter (INDEPENDENT_AMBULATORY_CARE_PROVIDER_SITE_OTHER): Payer: Self-pay | Admitting: *Deleted

## 2022-03-20 ENCOUNTER — Encounter (INDEPENDENT_AMBULATORY_CARE_PROVIDER_SITE_OTHER): Payer: Self-pay | Admitting: *Deleted

## 2022-04-11 ENCOUNTER — Other Ambulatory Visit (HOSPITAL_COMMUNITY): Payer: Self-pay | Admitting: Obstetrics & Gynecology

## 2022-04-11 DIAGNOSIS — Z1231 Encounter for screening mammogram for malignant neoplasm of breast: Secondary | ICD-10-CM

## 2022-05-07 ENCOUNTER — Ambulatory Visit (HOSPITAL_COMMUNITY)
Admission: RE | Admit: 2022-05-07 | Discharge: 2022-05-07 | Disposition: A | Discharge status: Home / Self Care | Payer: BC Managed Care – PPO | Source: Ambulatory Visit | Attending: Obstetrics & Gynecology | Admitting: Obstetrics & Gynecology

## 2022-05-07 DIAGNOSIS — Z1231 Encounter for screening mammogram for malignant neoplasm of breast: Secondary | ICD-10-CM | POA: Insufficient documentation

## 2022-06-25 ENCOUNTER — Ambulatory Visit (INDEPENDENT_AMBULATORY_CARE_PROVIDER_SITE_OTHER): Payer: BC Managed Care – PPO | Admitting: Gastroenterology

## 2022-06-25 ENCOUNTER — Encounter (INDEPENDENT_AMBULATORY_CARE_PROVIDER_SITE_OTHER): Payer: Self-pay | Admitting: Gastroenterology

## 2022-06-25 VITALS — BP 113/77 | HR 88 | Temp 97.8°F | Ht 63.0 in | Wt 128.8 lb

## 2022-06-25 DIAGNOSIS — R194 Change in bowel habit: Secondary | ICD-10-CM | POA: Insufficient documentation

## 2022-06-25 NOTE — Progress Notes (Signed)
Dana Cook, M.D. Gastroenterology & Hepatology [facility] [address] [place], [place] [address] Primary Care Physician: Renee Rival, NP [place] [id number]  Referring MD: PCP  Chief Complaint:  change in bowel habits and establish care  History of Present Illness: Dana Cook is a 62 y.o. female with PMH OA, who presents for evaluation change in bowel habits and to establish care.  Patient reports she is usually very regular tohave a bowel movement, but states that every once in a while she may have to strain to move her bowels. The patient denies having any nausea, vomiting, fever, chills, hematochezia, melena, hematemesis, abdominal distention, abdominal pain, diarrhea, jaundice, pruritus or weight loss.  Last SMO:[facility] Last Colonoscopy:10/2013 - normal  FHx: mother had PBC cirrhosis, neg for any gastrointestinal/liver disease, no malignancies Social: neg smoking, alcohol or illicit drug use Surgical: no abdominal surgeries  Past Medical History: Past Medical History:  Diagnosis Date   Allergy    Atypical glandular cells of undetermined significance (AGUS) on cervical Pap smear 2008   TWICE, D&C negative   Chicken pox    Chronic sinusitis    Osteoarthritis     Past Surgical History: Past Surgical History:  Procedure Laterality Date   ABDOMINAL EXPOSURE N/A 09/02/2018   Procedure: ABDOMINAL EXPOSURE;  Surgeon: Serafina Mitchell, MD;  Location: [place];  Service: Vascular;  Laterality: N/A;   ANTERIOR LUMBAR FUSION N/A 09/02/2018   Procedure: Lumbar five Sacral one  Anterior lumbar interbody fusion;  Surgeon: Erline Levine, MD;  Location: [place];  Service: Neurosurgery;  Laterality: N/A;   COLPOSCOPY  2015   LSIL   DILATION AND CURETTAGE OF UTERUS  2008   FINGER FRACTURE SURGERY Right 1983   5th finger    Family History: Family History  Problem Relation Age of Onset   Hypertension Mother     Heart disease Father    Hypertension Father    Hypertension Paternal Grandmother    Colon cancer Neg Hx     Social History: Social History   Tobacco Use  Smoking Status Never  Smokeless Tobacco Never   Social History   Substance and Sexual Activity  Alcohol Use No   Social History   Substance and Sexual Activity  Drug Use No    Allergies: Allergies  Allergen Reactions   Erythromycin Nausea And Vomiting    Stomach cramps    Medications: Current Outpatient Medications  Medication Sig Dispense Refill   calcium citrate (CALCITRATE - DOSED IN MG ELEMENTAL CALCIUM) 950 (200 Ca) MG tablet Take 1,200 mg of elemental calcium by mouth daily. + 25 mcg D 3     Misc Natural Products (OSTEO BI-FLEX TRIPLE STRENGTH) TABS Take 1 tablet by mouth daily.     Multiple Vitamin (MULTIVITAMIN WITH MINERALS) TABS tablet Take 1 tablet by mouth daily.     OVER THE COUNTER MEDICATION Triple action sleep prn.     rOPINIRole (REQUIP) 0.5 MG tablet Take 1 tablet (0.5 mg total) by mouth at bedtime as needed (restless leg). 90 tablet 3   No current facility-administered medications for this visit.    Review of Systems: GENERAL: negative for malaise, night sweats HEENT: No changes in hearing or vision, no nose bleeds or other nasal problems. NECK: Negative for lumps, goiter, pain and significant neck swelling RESPIRATORY: Negative for cough, wheezing CARDIOVASCULAR: Negative for chest pain, leg swelling, palpitations, orthopnea GI: SEE HPI MUSCULOSKELETAL: Negative for joint pain or swelling,  back pain, and muscle pain. SKIN: Negative for lesions, rash PSYCH: Negative for sleep disturbance, mood disorder and recent psychosocial stressors. HEMATOLOGY Negative for prolonged bleeding, bruising easily, and swollen nodes. ENDOCRINE: Negative for cold or heat intolerance, polyuria, polydipsia and goiter. NEURO: negative for tremor, gait imbalance, syncope and seizures. The remainder of the review of  systems is noncontributory.   Physical Exam: BP 113/77 (BP Location: Left Arm, Patient Position: Sitting, Cuff Size: Small)   Pulse 88   Temp 97.8 F (36.6 C) (Temporal)   Ht '5\' 3"'$  (1.6 m)   Wt 128 lb 12.8 oz (58.4 kg)   LMP 12/10/2011 (Approximate)   BMI 22.82 kg/m  GENERAL: The patient is AO x3, in no acute distress. HEENT: Head is normocephalic and atraumatic. EOMI are intact. Mouth is well hydrated and without lesions. NECK: Supple. No masses LUNGS: Clear to auscultation. No presence of rhonchi/wheezing/rales. Adequate chest expansion HEART: RRR, normal s1 and s2. ABDOMEN: Soft, nontender, no guarding, no peritoneal signs, and nondistended. BS +. No masses. EXTREMITIES: Without any cyanosis, clubbing, rash, lesions or edema. NEUROLOGIC: AOx3, no focal motor deficit. SKIN: no jaundice, no rashes   Imaging/Labs: as above  I personally reviewed and interpreted the available labs, imaging and endoscopic files.  Impression and Plan: Dana Cook is a 62 y.o. female with PMH OA, who presents for evaluation change in bowel habits and to establish care.  The patient has had very minimal symptoms but occasionally has to strain to move her bowels.  We discussed the potential dietary options to decrease the frequency of constipation episodes, she will benefit from eating kiwi on a daily basis to avoid straining and to soften her bowel movements.  She is not due yet for colorectal cancer screening.  More than 50% of the office visit was dedicated to discussing the procedure, including the day of and risks involved. Patient understands what the procedure involves including the benefits and any risks. Patient understands alternatives to the proposed procedure. Risks including (but not limited to) bleeding, tearing of the lining (perforation), rupture of adjacent organs, problems with heart and lung function, infection, and medication reactions. A small percentage of complications may  require surgery, hospitalization, repeat endoscopic procedure, and/or transfusion. A small percentage of polyps and other tumors may not be seen.  The patient was advised to call back in April 2025 to schedule her screening colonoscopy.  -Eat kiwi daily to avoid straining -Call us back in 09/2023 to schedule your screening colonoscopy  All questions were answered.      Dana Peppers, MD Gastroenterology and Hepatology [facility]

## 2022-06-25 NOTE — Patient Instructions (Signed)
Eat kiwi daily to avoid straining Call us back in 09/2023 to schedule your screening colonoscopy

## 2022-07-02 ENCOUNTER — Ambulatory Visit (INDEPENDENT_AMBULATORY_CARE_PROVIDER_SITE_OTHER): Payer: BC Managed Care – PPO

## 2022-07-02 ENCOUNTER — Ambulatory Visit (INDEPENDENT_AMBULATORY_CARE_PROVIDER_SITE_OTHER): Payer: BC Managed Care – PPO | Admitting: Physician Assistant

## 2022-07-02 ENCOUNTER — Encounter: Payer: Self-pay | Admitting: Physician Assistant

## 2022-07-02 DIAGNOSIS — M79642 Pain in left hand: Secondary | ICD-10-CM | POA: Diagnosis not present

## 2022-07-02 DIAGNOSIS — M79641 Pain in right hand: Secondary | ICD-10-CM | POA: Insufficient documentation

## 2022-07-02 DIAGNOSIS — M79672 Pain in left foot: Secondary | ICD-10-CM

## 2022-07-02 NOTE — Progress Notes (Signed)
Office Visit Note   Patient: Dana Cook           Date of Birth: [birth year]           MRN: [id number] Visit Date: 07/02/2022              Requested by: Dana Rival, NP [place],  [place] [id number] PCP: Dana Rival, NP  No chief complaint on file.     HPI: Dana Cook is a pleasant 62 year old woman who is a previous patient of Dr. Durward Cook.  She has a long history of arthritis in both of her hands as well as her left foot.  She has had periodic injections with Dr. Durward Cook.  She comes in today complaining of onset of hand pain and left foot pain over her great toe.  She has not had x-rays in quite a few years  Assessment & Plan: Visit Diagnoses:  1. Bilateral hand pain   2. Pain in left foot     Plan: She has bilateral CMC arthritis of the thumbs.  She has had gotten some stiffness that is developed more and has difficulty opposing her thumb to her fifth digit.  She did get long-term relief of several years with injections I suggested she try this again.  I told her that this would best be done under ultrasound with Dr. Rolena Cook and she is in agreement to do this.  Great toe also has hallux rigidus of the left MTP joint.  Again would recommend injection.  She will follow-up with Dr. Rolena Cook next week.  She understands he will only do 2 of the 3 joints at 1 time I will also offered referral to hand surgeon but she would like to try injections since they worked for so long  Follow-Up Instructions: 1 week  Ortho Exam  Patient is alert, oriented, no adenopathy, well-dressed, normal affect, normal respiratory effort. Bilateral hand she has strong radial pulses brisk capillary refill.  She has tenderness over the first [facility] joints of both hands.  No redness no cellulitis.  She does have deformity of the right PIP joint which is chronic neurovascular intact  Imaging: No results found. No images are attached to the encounter.  Labs: Lab Results  Component Value  Date   HGBA1C 4.6 10/01/2016   ESRSEDRATE 23 (H) 03/19/2013   LABORGA NO GROWTH 10/01/2016     Lab Results  Component Value Date   ALBUMIN 4.3 05/02/2021   ALBUMIN 4.4 08/18/2020   ALBUMIN 4.3 11/01/2017    Lab Results  Component Value Date   MG 1.9 05/02/2021   Lab Results  Component Value Date   VD25OH 26.9 (L) 05/02/2021   VD25OH 98.0 11/01/2017   VD25OH 35.2 09/03/2016    No results found for: "PREALBUMIN"    Latest Ref Rng & Units 08/18/2020   11:30 AM 08/20/2018    1:29 PM 11/01/2017    3:42 PM  CBC EXTENDED  WBC 4.0 - 10.5 K/uL 4.6  6.9  6.6   RBC 3.87 - 5.11 MIL/uL 4.57  4.58  4.55   Hemoglobin 12.0 - 15.0 g/dL 14.0  13.5  13.5   HCT 36.0 - 46.0 % 40.1  40.3  41.7   Platelets 150 - 400 K/uL 249  239  247      There is no height or weight on file to calculate BMI.  Orders:  Orders Placed This Encounter  Procedures   XR Hand Complete Left   XR  Hand Complete Right   XR Foot Complete Left   No orders of the defined types were placed in this encounter.    Procedures: No procedures performed  Clinical Data: No additional findings.  ROS:  All other systems negative, except as noted in the HPI. Review of Systems  Objective: Vital Signs: LMP 12/10/2011 (Approximate)   Specialty Comments:  No specialty comments available.  PMFS History: Patient Active Problem List   Diagnosis Date Noted   Pain in left foot 07/02/2022   Bilateral hand pain 07/02/2022   Change in bowel habits 06/25/2022   History of abnormal cervical Pap smear 08/30/2021   Sternal mass 05/10/2021   Dysplasia of cervix, low grade (CIN 1) 08/18/2020   Lumbar scoliosis 09/02/2018   Epigastric pain 12/10/2017   Chronic midline low back pain without sciatica 10/31/2016   RLS (restless legs syndrome) 12/05/2015   Encounter for colorectal cancer screening 10/07/2013   Past Medical History:  Diagnosis Date   Allergy    Atypical glandular cells of undetermined significance (AGUS)  on cervical Pap smear 2008   TWICE, D&C negative   Chicken pox    Chronic sinusitis    Osteoarthritis     Family History  Problem Relation Age of Onset   Hypertension Mother    Heart disease Father    Hypertension Father    Hypertension Paternal Grandmother    Colon cancer Neg Hx     Past Surgical History:  Procedure Laterality Date   ABDOMINAL EXPOSURE N/A 09/02/2018   Procedure: ABDOMINAL EXPOSURE;  Surgeon: Serafina Mitchell, MD;  Location: [place];  Service: Vascular;  Laterality: N/A;   ANTERIOR LUMBAR FUSION N/A 09/02/2018   Procedure: Lumbar five Sacral one  Anterior lumbar interbody fusion;  Surgeon: Erline Levine, MD;  Location: [place];  Service: Neurosurgery;  Laterality: N/A;   COLPOSCOPY  2015   LSIL   DILATION AND CURETTAGE OF UTERUS  2008   FINGER FRACTURE SURGERY Right 1983   5th finger   Social History   Occupational History   Not on file  Tobacco Use   Smoking status: Never   Smokeless tobacco: Never  Vaping Use   Vaping Use: Never used  Substance and Sexual Activity   Alcohol use: No   Drug use: No   Sexual activity: Not Currently    Partners: Male    Birth control/protection: Post-menopausal

## 2022-07-10 ENCOUNTER — Ambulatory Visit: Payer: BC Managed Care – PPO | Admitting: Sports Medicine

## 2022-07-10 ENCOUNTER — Encounter: Payer: Self-pay | Admitting: Sports Medicine

## 2022-07-10 DIAGNOSIS — M1812 Unilateral primary osteoarthritis of first carpometacarpal joint, left hand: Secondary | ICD-10-CM

## 2022-07-10 DIAGNOSIS — M79641 Pain in right hand: Secondary | ICD-10-CM | POA: Diagnosis not present

## 2022-07-10 DIAGNOSIS — M1811 Unilateral primary osteoarthritis of first carpometacarpal joint, right hand: Secondary | ICD-10-CM | POA: Diagnosis not present

## 2022-07-10 DIAGNOSIS — M79642 Pain in left hand: Secondary | ICD-10-CM

## 2022-07-10 DIAGNOSIS — M1810 Unilateral primary osteoarthritis of first carpometacarpal joint, unspecified hand: Secondary | ICD-10-CM | POA: Diagnosis not present

## 2022-07-10 MED ORDER — BETAMETHASONE SOD PHOS & ACET 6 (3-3) MG/ML IJ SUSP
6.0000 mg | INTRAMUSCULAR | Status: AC | PRN
  Administered 2022-07-10: 6 mg via INTRA_ARTICULAR

## 2022-07-10 MED ORDER — LIDOCAINE HCL 1 % IJ SOLN
0.5000 mL | INTRAMUSCULAR | Status: AC | PRN
Start: 2022-07-10 — End: 2022-07-10
  Administered 2022-07-10: .5 mL

## 2022-07-10 NOTE — Progress Notes (Signed)
Bilateral thumb pain Feels like she is losing gripping ability due to pain Takes aleve occasionally

## 2022-07-10 NOTE — Progress Notes (Signed)
Dana Cook - 62 y.o. female MRN [id number]  Date of birth: Dec 09, 1960  Office Visit Note: Visit Date: 07/10/2022 PCP: Renee Rival, NP Referred by: Renee Rival, NP  Subjective: Chief Complaint  Patient presents with   Left Hand - Pain   Right Hand - Pain   HPI: Dana Cook is a pleasant 62 y.o. female who presents today for bilateral hand/thumb pain with known CMC joint OA.  She has had pain in the thumbs over the years.  She reports somewhere between 8-10-year she had [facility] joint injections performed by Dr. Durward Fortes which gave her good relief.  Her pain has been more frequent since November, worsening over the last few weeks.  She is right-hand dominant, but her left is worse than the right.  She is getting pain with gripping and noticing some stiffness.   Pertinent ROS were reviewed with the patient and found to be negative unless otherwise specified above in HPI.   Assessment & Plan: Visit Diagnoses:  1. Bilateral hand pain   2. Localized primary osteoarthritis of carpometacarpal (CMC) joint of thumb   3. Primary osteoarthritis of first carpometacarpal joint of right hand    Plan: Reviewed her x-rays which did not show CMC joint arthritis of both thumbs, left greater than right.  Discussed treatment options such as oral medication therapy, bracing, injection therapy.  Through shared decision making, elected to proceed with bilateral CMC joint injection, she tolerated well.  Recommend ice and over-the-counter anti-inflammatories for any postinjection pain.  Did discuss we can always fit her for a [place] cool comfort brace with activity in the future if she desires.  She will return if for some reason this does not give her good relief.  She also mentions she has first MTP joint pain/OA, she may follow-up for this or receive injection in future if she desires.  Follow-up: Return if symptoms worsen or fail to improve.   Meds & Orders: No orders of the defined  types were placed in this encounter.   Orders Placed This Encounter  Procedures   Hand/UE Inj     Procedures: Hand/UE Inj: bilateral thumb CMC for osteoarthritis on 07/10/2022 2:29 PM Indications: pain and joint swelling Details: 25 G needle, radial approach Medications (Right): 0.5 mL lidocaine 1 %; 6 mg betamethasone acetate-betamethasone sodium phosphate 6 (3-3) MG/ML Medications (Left): 0.5 mL lidocaine 1 %; 6 mg betamethasone acetate-betamethasone sodium phosphate 6 (3-3) MG/ML  Procedure: CMC joint injection, bilateral thumb After informed verbal consent was obtained a timeout was performed, patient was seated on exam table. The patient's left hand was placed on stable surface with medial hand on table.  Identification of the [facility] joint was performed by palpation. Using a 25-gauge, 1" needle was inserted into the [facility] joint. The joint was subsequently injected with a 0.5:1.0 cc of lidocaine:betamethasone with injectate spread into the joint without resistance. Patient tolerated the procedure well without immediate complications. Band-aid applied.  After informed verbal consent was obtained a timeout was performed, patient was seated on exam table. The patient's right hand was placed on stable surface with medial hand on table.  Identification of the [facility] joint was performed by palpation. Using a 25-gauge, 1" needle was inserted into the [facility] joint. The joint was subsequently injected with a 0.5:1.0 cc of lidocaine:betamethasone with injectate spread into the joint without resistance. Patient tolerated the procedure well without immediate complications. Band-aid applied.  Procedure, treatment alternatives, risks and benefits explained, specific risks discussed. Consent  was given by the patient. Immediately prior to procedure a time out was called to verify the correct patient, procedure, equipment, support staff and site/side marked as required. Patient was prepped and draped in the usual sterile  fashion.          Clinical History: No specialty comments available.  She reports that she has never smoked. She has never used smokeless tobacco. No results for input(s): "HGBA1C", "LABURIC" in the last 8760 hours.  Objective:   Vital Signs: LMP 12/10/2011 (Approximate)   Physical Exam  Gen: Well-appearing, in no acute distress; non-toxic CV: Regular Rate. Well-perfused. Warm.  Resp: Breathing unlabored on room air; no wheezing. Psych: Fluid speech in conversation; appropriate affect; normal thought process Neuro: Sensation intact throughout. No gross coordination deficits.   Ortho Exam - Bilateral hands: + TTP.  He has a dorsal thenar eminence, no significant TTP at the [facility] joint region.  Positive CMC grind test.  Okay sign intact.  No gross restriction with flexion, extension of the wrist or adduction of the thumb. NVI.  Imaging:  *Independent review of the right hand and left ankle from 07/02/2018.  Was reviewed and interpreted by myself.  3 views were interpreted including AP, oblique and lateral film.  X-rays demonstrate moderate CMC arthritis of the right thumb.  There is moderate to severe CMC arthritis of the left thumb with sclerosis noted proximally.  XR Hand Complete Left Radiographs of the left hand demonstrate degenerative changes of the first  [facility] joint with sclerotic changes and joint space loss.  No acute fractures XR Foot Complete Left Three-view x-rays of her left foot were obtained today.  Well-maintained  alignment no acute fracture she does have hallux rigidus with loss of  joint space and sclerotic changes XR Hand Complete Right Radiographs of her right hand were obtained today.  She has CMC arthritis  of the thumb.  Well-maintained alignment no acute fractures noted has a  chronic fracture with angulation of the base of the middle phalanx at the  PIP joint    Past Medical/Family/Surgical/Social History: Medications & Allergies reviewed per EMR, new  medications updated. Patient Active Problem List   Diagnosis Date Noted   Pain in left foot 07/02/2022   Bilateral hand pain 07/02/2022   Change in bowel habits 06/25/2022   History of abnormal cervical Pap smear 08/30/2021   Sternal mass 05/10/2021   Dysplasia of cervix, low grade (CIN 1) 08/18/2020   Lumbar scoliosis 09/02/2018   Epigastric pain 12/10/2017   Chronic midline low back pain without sciatica 10/31/2016   RLS (restless legs syndrome) 12/05/2015   Encounter for colorectal cancer screening 10/07/2013   Past Medical History:  Diagnosis Date   Allergy    Atypical glandular cells of undetermined significance (AGUS) on cervical Pap smear 2008   TWICE, D&C negative   Chicken pox    Chronic sinusitis    Osteoarthritis    Family History  Problem Relation Age of Onset   Hypertension Mother    Heart disease Father    Hypertension Father    Hypertension Paternal Grandmother    Colon cancer Neg Hx    Past Surgical History:  Procedure Laterality Date   ABDOMINAL EXPOSURE N/A 09/02/2018   Procedure: ABDOMINAL EXPOSURE;  Surgeon: Serafina Mitchell, MD;  Location: [place];  Service: Vascular;  Laterality: N/A;   ANTERIOR LUMBAR FUSION N/A 09/02/2018   Procedure: Lumbar five Sacral one  Anterior lumbar interbody fusion;  Surgeon: Erline Levine, MD;  Location:  [place] [facility];  Service: Neurosurgery;  Laterality: N/A;   COLPOSCOPY  2015   LSIL   DILATION AND CURETTAGE OF UTERUS  2008   FINGER FRACTURE SURGERY Right 1983   5th finger   Social History   Occupational History   Not on file  Tobacco Use   Smoking status: Never   Smokeless tobacco: Never  Vaping Use   Vaping Use: Never used  Substance and Sexual Activity   Alcohol use: No   Drug use: No   Sexual activity: Not Currently    Partners: Male    Birth control/protection: Post-menopausal

## 2022-08-20 ENCOUNTER — Other Ambulatory Visit (HOSPITAL_COMMUNITY): Payer: Self-pay | Admitting: Neurosurgery

## 2022-08-20 DIAGNOSIS — M5440 Lumbago with sciatica, unspecified side: Secondary | ICD-10-CM

## 2022-09-10 ENCOUNTER — Ambulatory Visit (HOSPITAL_BASED_OUTPATIENT_CLINIC_OR_DEPARTMENT_OTHER): Payer: BC Managed Care – PPO | Admitting: Obstetrics & Gynecology

## 2022-09-13 ENCOUNTER — Ambulatory Visit (HOSPITAL_COMMUNITY)
Admission: RE | Admit: 2022-09-13 | Discharge: 2022-09-13 | Disposition: A | Discharge status: Home / Self Care | Payer: BC Managed Care – PPO | Source: Ambulatory Visit | Attending: Neurosurgery | Admitting: Neurosurgery

## 2022-09-13 DIAGNOSIS — M5440 Lumbago with sciatica, unspecified side: Secondary | ICD-10-CM | POA: Insufficient documentation

## 2022-09-18 ENCOUNTER — Ambulatory Visit (INDEPENDENT_AMBULATORY_CARE_PROVIDER_SITE_OTHER): Payer: BC Managed Care – PPO | Admitting: Obstetrics & Gynecology

## 2022-09-18 ENCOUNTER — Other Ambulatory Visit (HOSPITAL_COMMUNITY)
Admission: RE | Admit: 2022-09-18 | Discharge: 2022-09-18 | Disposition: A | Discharge status: Home / Self Care | Payer: BC Managed Care – PPO | Source: Ambulatory Visit | Attending: Obstetrics & Gynecology | Admitting: Obstetrics & Gynecology

## 2022-09-18 ENCOUNTER — Encounter (HOSPITAL_BASED_OUTPATIENT_CLINIC_OR_DEPARTMENT_OTHER): Payer: Self-pay | Admitting: Obstetrics & Gynecology

## 2022-09-18 VITALS — BP 118/75 | HR 91 | Ht 62.0 in | Wt 127.6 lb

## 2022-09-18 DIAGNOSIS — Z124 Encounter for screening for malignant neoplasm of cervix: Secondary | ICD-10-CM

## 2022-09-18 DIAGNOSIS — Z8742 Personal history of other diseases of the female genital tract: Secondary | ICD-10-CM | POA: Diagnosis not present

## 2022-09-18 DIAGNOSIS — Z01419 Encounter for gynecological examination (general) (routine) without abnormal findings: Secondary | ICD-10-CM

## 2022-09-18 NOTE — Progress Notes (Signed)
62 y.o. G0P0000 Single White or Caucasian female here for annual exam.  Doing well.  Denies vaginal bleeding.  Had lab work in December with PCP.  I do not have this   Patient's last menstrual period was 12/10/2011 (approximate).          Sexually active: No.  The current method of family planning is post menopausal status.    Exercising: Yes.    Walking and yard work Smoker:  no  Health Maintenance: Pap:  08/2020 History of abnormal Pap:  no MMG:  04/2022 Colonoscopy:  10/15/2013, Due next year.  Pt has already had a consult for colonoscopy in 2015.   BMD:   04/2021, osteoporosis Screening Labs: 05/2022   reports that she has never smoked. She has never used smokeless tobacco. She reports that she does not drink alcohol and does not use drugs.  Past Medical History:  Diagnosis Date   Allergy    Atypical glandular cells of undetermined significance (AGUS) on cervical Pap smear 2008   TWICE, D&C negative   Chicken pox    Chronic sinusitis    Osteoarthritis     Past Surgical History:  Procedure Laterality Date   ABDOMINAL EXPOSURE N/A 09/02/2018   Procedure: ABDOMINAL EXPOSURE;  Surgeon: Nada Libman, MD;  Location: [facility];  Service: Vascular;  Laterality: N/A;   ANTERIOR LUMBAR FUSION N/A 09/02/2018   Procedure: Lumbar five Sacral one  Anterior lumbar interbody fusion;  Surgeon: Maeola Harman, MD;  [facility];  Service: Neurosurgery;  Laterality: N/A;   COLPOSCOPY  2015   LSIL   DILATION AND CURETTAGE OF UTERUS  2008   FINGER FRACTURE SURGERY Right 1983   5th finger    Current Outpatient Medications  Medication Sig Dispense Refill   calcium citrate (CALCITRATE - DOSED IN MG ELEMENTAL CALCIUM) 950 (200 Ca) MG tablet Take 1,200 mg of elemental calcium by mouth daily. + 25 mcg D 3     Misc Natural Products (OSTEO BI-FLEX TRIPLE STRENGTH) TABS Take 1 tablet by mouth daily.     Multiple Vitamin (MULTIVITAMIN WITH MINERALS) TABS tablet Take 1 tablet by mouth daily.     OVER  THE COUNTER MEDICATION Triple action sleep prn.     rOPINIRole (REQUIP) 0.5 MG tablet Take 1 tablet (0.5 mg total) by mouth at bedtime as needed (restless leg). 90 tablet 3   No current facility-administered medications for this visit.    Family History  Problem Relation Age of Onset   Hypertension Mother    Heart disease Father    Hypertension Father    Hypertension Paternal Grandmother    Colon cancer Neg Hx     ROS: Constitutional: negative Genitourinary:negative  Exam:   BP 118/75 (BP Location: Right Arm, Patient Position: Sitting, Cuff Size: Normal)   Pulse 91   Ht 5\' 2"  (1.575 m) Comment: Reported  Wt 127 lb 9.6 oz (57.9 kg)   LMP 12/10/2011 (Approximate)   BMI 23.34 kg/m   Height: 5\' 2"  (157.5 cm) (Reported)  General appearance: alert, cooperative and appears stated age Head: Normocephalic, without obvious abnormality, atraumatic Neck: no adenopathy, supple, symmetrical, trachea midline and thyroid normal to inspection and palpation Lungs: clear to auscultation bilaterally Breasts: normal appearance, no masses or tenderness Heart: regular rate and rhythm Abdomen: soft, non-tender; bowel sounds normal; no masses,  no organomegaly Extremities: extremities normal, atraumatic, no cyanosis or edema Skin: Skin color, texture, turgor normal. No rashes or lesions Lymph nodes: Cervical, supraclavicular, and axillary nodes  normal. No abnormal inguinal nodes palpated Neurologic: Grossly normal   Pelvic: External genitalia:  no lesions              Urethra:  normal appearing urethra with no masses, tenderness or lesions              Bartholins and Skenes: normal                 Vagina: normal appearing vagina with normal color and no discharge, no lesions              Cervix: no lesions              Pap taken: No. Bimanual Exam:  Uterus:  normal size, contour, position, consistency, mobility, non-tender              Adnexa: normal adnexa and no mass, fullness, tenderness                Rectovaginal: Confirms               Anus:  normal sphincter tone, no lesions  Chaperone, Ina Homes, CMA, was present for exam.  Assessment/Plan: 1. Well woman exam with routine gynecological exam - Pap smear only obtained today.  Pt requested pap smear today. - Mammogram up to date - Colonoscopy due next year - Bone mineral density 2022.  Declines treatment.  Will repeat 1-2 years - lab work done with PCP - vaccines reviewed/updated  2. History of abnormal cervical Pap smear  3. Cervical cancer screening - Cytology - PAP( [place])

## 2022-09-21 LAB — CYTOLOGY - PAP
Comment: NEGATIVE
Diagnosis: NEGATIVE
High risk HPV: NEGATIVE

## 2023-05-21 ENCOUNTER — Other Ambulatory Visit (HOSPITAL_COMMUNITY): Payer: Self-pay | Admitting: Obstetrics & Gynecology

## 2023-05-21 DIAGNOSIS — Z1231 Encounter for screening mammogram for malignant neoplasm of breast: Secondary | ICD-10-CM

## 2023-05-29 ENCOUNTER — Other Ambulatory Visit (HOSPITAL_COMMUNITY): Payer: Self-pay | Admitting: Nurse Practitioner

## 2023-05-29 DIAGNOSIS — Z136 Encounter for screening for cardiovascular disorders: Secondary | ICD-10-CM

## 2023-06-17 ENCOUNTER — Ambulatory Visit (HOSPITAL_COMMUNITY)
Admission: RE | Admit: 2023-06-17 | Discharge: 2023-06-17 | Disposition: A | Discharge status: Home / Self Care | Payer: 59 | Source: Ambulatory Visit | Attending: Nurse Practitioner | Admitting: Nurse Practitioner

## 2023-06-17 ENCOUNTER — Ambulatory Visit (HOSPITAL_COMMUNITY)
Admission: RE | Admit: 2023-06-17 | Discharge: 2023-06-17 | Disposition: A | Discharge status: Home / Self Care | Payer: 59 | Source: Ambulatory Visit | Attending: Obstetrics & Gynecology | Admitting: Obstetrics & Gynecology

## 2023-06-17 DIAGNOSIS — Z1231 Encounter for screening mammogram for malignant neoplasm of breast: Secondary | ICD-10-CM | POA: Insufficient documentation

## 2023-06-17 DIAGNOSIS — Z136 Encounter for screening for cardiovascular disorders: Secondary | ICD-10-CM | POA: Insufficient documentation

## 2023-08-12 ENCOUNTER — Telehealth: Payer: Self-pay | Admitting: Gastroenterology

## 2023-08-12 NOTE — Telephone Encounter (Signed)
 Good Morning Dr. Marina Goodell, I received a call from this patient requesting to get her colonoscopy procedure scheduled. Patient was since at [facility] in [facility] for some bowel changes. Would you please advise on scheduling.   Thank you.

## 2023-08-16 ENCOUNTER — Encounter: Payer: Self-pay | Admitting: Internal Medicine

## 2023-08-16 NOTE — Telephone Encounter (Signed)
 She had colonoscopy with me in 2015.  Yes, okay to set up SCREENING COLONOSCOPY in [facility] with me. Thanks, Dr. Marina Goodell

## 2023-09-10 ENCOUNTER — Ambulatory Visit (HOSPITAL_COMMUNITY)

## 2023-09-11 ENCOUNTER — Ambulatory Visit (AMBULATORY_SURGERY_CENTER): Admitting: *Deleted

## 2023-09-11 VITALS — Ht 63.0 in | Wt 124.0 lb

## 2023-09-11 DIAGNOSIS — Z1211 Encounter for screening for malignant neoplasm of colon: Secondary | ICD-10-CM

## 2023-09-11 MED ORDER — SUFLAVE 178.7 G PO SOLR: 1.0000 | Freq: Once | ORAL | 0 refills | Status: AC

## 2023-09-11 NOTE — Progress Notes (Signed)
 Pt's name and DOB verified at the beginning of the pre-visit wit 2 identifiers  Pt denies any difficulty with ambulating,sitting, laying down or rolling side to side  Pt has no issues with ambulation   Pt has no issues moving head neck or swallowing  No egg or soy allergy known to patient   HX PONV  Pt denies having issues being intubated  No FH of Malignant Hyperthermia  Pt is not on diet pills or shots  Pt is not on home 02   Pt is not on blood thinners   Pt denies issues with constipation   Pt is not on dialysis  Pt denise any abnormal heart rhythms   Pt denies any upcoming cardiac testing  Patient's chart reviewed by Cathlyn Parsons CNRA prior to pre-visit and patient appropriate for the [facility].  Pre-visit completed and red dot placed by patient's name on their procedure day (on provider's schedule).    Visit by phone  Pt states weight is 124 lb  IInstructions reviewed. Pt given [facility], [facility] main # and MD on call # prior to instructions.  Pt states understanding of instructions. Instructed to review again prior to procedure. Pt states they will.   Informed pt that they will receive a text or  call from [facility] regarding there prep med.

## 2023-09-13 ENCOUNTER — Other Ambulatory Visit (HOSPITAL_COMMUNITY): Payer: Self-pay | Admitting: Orthopedic Surgery

## 2023-09-13 DIAGNOSIS — M25562 Pain in left knee: Secondary | ICD-10-CM

## 2023-09-17 ENCOUNTER — Ambulatory Visit (HOSPITAL_COMMUNITY)
Admission: RE | Admit: 2023-09-17 | Discharge: 2023-09-17 | Disposition: A | Discharge status: Home / Self Care | Source: Ambulatory Visit | Attending: Orthopedic Surgery | Admitting: Orthopedic Surgery

## 2023-09-17 DIAGNOSIS — M25562 Pain in left knee: Secondary | ICD-10-CM | POA: Diagnosis present

## 2023-09-23 ENCOUNTER — Ambulatory Visit (HOSPITAL_BASED_OUTPATIENT_CLINIC_OR_DEPARTMENT_OTHER): Payer: BC Managed Care – PPO | Admitting: Obstetrics & Gynecology

## 2023-09-26 ENCOUNTER — Telehealth: Payer: Self-pay | Admitting: *Deleted

## 2023-09-26 ENCOUNTER — Encounter: Payer: Self-pay | Admitting: Internal Medicine

## 2023-09-26 ENCOUNTER — Telehealth: Payer: Self-pay | Admitting: Internal Medicine

## 2023-09-26 NOTE — Telephone Encounter (Signed)
 Called home number and hangs up or disconnects.  Called mobile number and left message.

## 2023-09-26 NOTE — Telephone Encounter (Signed)
 Left a message for the patient letting her know to call back and leave an alternate number for us  to reach her, since we were unable to reach her with the number in our system.

## 2023-09-26 NOTE — Telephone Encounter (Signed)
PV completed.  °

## 2023-09-26 NOTE — Telephone Encounter (Signed)
 Patient called and stated that she would like to go over what food she can not eat and what she can eat. Patient is schedule to have a colonoscopy on April 22 nd. Patient is requesting a call back.Please advise.

## 2023-09-26 NOTE — Telephone Encounter (Signed)
 Pt had questions about what she could eat and not eat. Reviewed list of foods she can and can't have during 5 days prior. Question regarding Suflave instructions answered. No other questions at this time.

## 2023-10-01 ENCOUNTER — Ambulatory Visit (AMBULATORY_SURGERY_CENTER): Admitting: Internal Medicine

## 2023-10-01 ENCOUNTER — Encounter: Payer: Self-pay | Admitting: Internal Medicine

## 2023-10-01 VITALS — BP 110/65 | HR 78 | Temp 98.1°F | Resp 11 | Ht 63.0 in | Wt 124.0 lb

## 2023-10-01 DIAGNOSIS — K635 Polyp of colon: Secondary | ICD-10-CM | POA: Diagnosis not present

## 2023-10-01 DIAGNOSIS — D128 Benign neoplasm of rectum: Secondary | ICD-10-CM

## 2023-10-01 DIAGNOSIS — Z1211 Encounter for screening for malignant neoplasm of colon: Secondary | ICD-10-CM | POA: Diagnosis present

## 2023-10-01 DIAGNOSIS — D122 Benign neoplasm of ascending colon: Secondary | ICD-10-CM | POA: Diagnosis not present

## 2023-10-01 MED ORDER — SODIUM CHLORIDE 0.9 % IV SOLN
500.0000 mL | Freq: Once | INTRAVENOUS | Status: DC
Start: 2023-10-01 — End: 2023-10-01

## 2023-10-01 NOTE — Patient Instructions (Signed)
 Educational handout provided to patient related to Polyps  Resume previous diet  Continue present medications  Awaiting pathology results  Repeat colonoscopy in 5 years for surveillance   YOU HAD AN ENDOSCOPIC PROCEDURE TODAY AT THE [facility]:   Refer to the procedure report that was given to you for any specific questions about what was found during the examination.  If the procedure report does not answer your questions, please call your gastroenterologist to clarify.  If you requested that your care partner not be given the details of your procedure findings, then the procedure report has been included in a sealed envelope for you to review at your convenience later.  YOU SHOULD EXPECT: Some feelings of bloating in the abdomen. Passage of more gas than usual.  Walking can help get rid of the air that was put into your GI tract during the procedure and reduce the bloating. If you had a lower endoscopy (such as a colonoscopy or flexible sigmoidoscopy) you may notice spotting of blood in your stool or on the toilet paper. If you underwent a bowel prep for your procedure, you may not have a normal bowel movement for a few days.  Please Note:  You might notice some irritation and congestion in your nose or some drainage.  This is from the oxygen used during your procedure.  There is no need for concern and it should clear up in a day or so.  SYMPTOMS TO REPORT IMMEDIATELY:  Following lower endoscopy (colonoscopy or flexible sigmoidoscopy):  Excessive amounts of blood in the stool  Significant tenderness or worsening of abdominal pains  Swelling of the abdomen that is new, acute  Fever of 100F or higher  For urgent or emergent issues, a gastroenterologist can be reached at any hour by calling [phone]. Do not use MyChart messaging for urgent concerns.    DIET:  We do recommend a small meal at first, but then you may proceed to your regular diet.  Drink plenty of fluids  but you should avoid alcoholic beverages for 24 hours.  ACTIVITY:  You should plan to take it easy for the rest of today and you should NOT DRIVE or use heavy machinery until tomorrow (because of the sedation medicines used during the test).    FOLLOW UP: Our staff will call the number listed on your records the next business day following your procedure.  We will call around 7:15- 8:00 am to check on you and address any questions or concerns that you may have regarding the information given to you following your procedure. If we do not reach you, we will leave a message.     If any biopsies were taken you will be contacted by phone or by letter within the next 1-3 weeks.  Please call us at [phone] if you have not heard about the biopsies in 3 weeks.    SIGNATURES/CONFIDENTIALITY: You and/or your care partner have signed paperwork which will be entered into your electronic medical record.  These signatures attest to the fact that that the information above on your After Visit Summary has been reviewed and is understood.  Full responsibility of the confidentiality of this discharge information lies with you and/or your care-partner.

## 2023-10-01 NOTE — Op Note (Signed)
 [facility] Patient Name: Dana Cook Procedure Date: 10/01/2023 11:47 AM MRN: [id number] Endoscopist: Murel Arlington. Elvin Hammer , MD, [id number] Age: 63 Referring MD:  Date of Birth: [birth year] Gender: Female Account #: [id number] Procedure:                Colonoscopy with cold snare polypectomy x 2 Indications:              Screening for colorectal malignant neoplasm. Prior                            examination 2015 was normal Medicines:                Monitored Anesthesia Care Procedure:                Pre-Anesthesia Assessment:                           - Prior to the procedure, a History and Physical                            was performed, and patient medications and                            allergies were reviewed. The patient's tolerance of                            previous anesthesia was also reviewed. The risks                            and benefits of the procedure and the sedation                            options and risks were discussed with the patient.                            All questions were answered, and informed consent                            was obtained. Prior Anticoagulants: The patient has                            taken no anticoagulant or antiplatelet agents. ASA                            Grade Assessment: II - A patient with mild systemic                            disease. After reviewing the risks and benefits,                            the patient was deemed in satisfactory condition to                            undergo the procedure.  After obtaining informed consent, the colonoscope                            was passed under direct vision. Throughout the                            procedure, the patient's blood pressure, pulse, and                            oxygen saturations were monitored continuously. The                            Olympus CF-HQ190L ([id number]) Colonoscope was                             introduced through the anus and advanced to the the                            cecum, identified by appendiceal orifice and                            ileocecal valve. The ileocecal valve, appendiceal                            orifice, and rectum were photographed. The quality                            of the bowel preparation was excellent. The                            colonoscopy was performed without difficulty. The                            patient tolerated the procedure well. The bowel                            preparation used was SUPREP via split dose                            instruction. Scope In: 12:00:24 PM Scope Out: 12:21:58 PM Scope Withdrawal Time: 0 hours 13 minutes 37 seconds  Total Procedure Duration: 0 hours 21 minutes 34 seconds  Findings:                 Two sessile polyps were found in the rectum and                            ascending colon. The polyps were 5 to 6 mm in size.                            These polyps were removed with a cold snare.                            Resection and retrieval were complete.  The exam was otherwise without abnormality on                            direct and retroflexion views. Complications:            No immediate complications. Estimated blood loss:                            None. Estimated Blood Loss:     Estimated blood loss: none. Impression:               - Two 5 to 6 mm polyps in the rectum and in the                            ascending colon, removed with a cold snare.                            Resected and retrieved.                           - The examination was otherwise normal on direct                            and retroflexion views. Recommendation:           - Repeat colonoscopy in 5 years for surveillance.                           - Patient has a contact number available for                            emergencies. The signs and symptoms of potential                             delayed complications were discussed with the                            patient. Return to normal activities tomorrow.                            Written discharge instructions were provided to the                            patient.                           - Resume previous diet.                           - Continue present medications.                           - Await pathology results. Murel Arlington. Elvin Hammer, MD 10/01/2023 12:34:38 PM This report has been signed electronically.

## 2023-10-01 NOTE — Progress Notes (Signed)
 A/O x 3, gd SR's, VSS, report to RN

## 2023-10-01 NOTE — Progress Notes (Signed)
 Pt's states no medical or surgical changes since previsit or office visit.

## 2023-10-01 NOTE — Progress Notes (Signed)
 Called to room to assist during endoscopic procedure.  Patient ID and intended procedure confirmed with present staff. Received instructions for my participation in the procedure from the performing physician.

## 2023-10-01 NOTE — Progress Notes (Signed)
 HISTORY OF PRESENT ILLNESS:  Dana Cook is a 63 y.o. female presents for colon cancer screening.  Prior examination 2015 was negative for neoplasia  REVIEW OF SYSTEMS:  All non-GI ROS negative except for  Past Medical History:  Diagnosis Date   Allergy    Atypical glandular cells of undetermined significance (AGUS) on cervical Pap smear 2008   TWICE, D&C negative   Chicken pox    Chronic sinusitis    Lumbar scoliosis    Osteoarthritis    Osteopenia    Restless leg syndrome    Sternal mass    collar bone  R    Past Surgical History:  Procedure Laterality Date   ABDOMINAL EXPOSURE N/A 09/02/2018   Procedure: ABDOMINAL EXPOSURE;  Surgeon: Margherita Shell, MD;  Location: [facility] OR;  Service: Vascular;  Laterality: N/A;   ANTERIOR LUMBAR FUSION N/A 09/02/2018   Procedure: Lumbar five Sacral one  Anterior lumbar interbody fusion;  Surgeon: Manya Sells, MD;  Location: [facility] OR;  Service: Neurosurgery;  Laterality: N/A;   COLPOSCOPY  2015   LSIL   DILATION AND CURETTAGE OF UTERUS  2008   FINGER FRACTURE SURGERY Right 1983   5th finger    Social History Dana Cook  reports that she has never smoked. She has never used smokeless tobacco. She reports that she does not drink alcohol  and does not use drugs.  family history includes Heart disease in her father; Hypertension in her father, mother, and paternal grandmother.  Allergies  Allergen Reactions   Erythromycin Nausea And Vomiting    Stomach cramps       PHYSICAL EXAMINATION: Vital signs: BP 121/72   Pulse 85   Temp 98.1 F (36.7 C)   Ht 5\' 3"  (1.6 m)   Wt 124 lb (56.2 kg)   LMP 12/10/2011 (Approximate)   SpO2 100%   BMI 21.97 kg/m  General: Well-developed, well-nourished, no acute distress HEENT: Sclerae are anicteric, conjunctiva pink. Oral mucosa intact Lungs: Clear Heart: Regular Abdomen: soft, nontender, nondistended, no obvious ascites, no peritoneal signs, normal bowel sounds. No  organomegaly. Extremities: No edema Psychiatric: alert and oriented x3. Cooperative     ASSESSMENT:  Colon cancer screening   PLAN:  Screening colonoscopy

## 2023-10-02 ENCOUNTER — Telehealth: Payer: Self-pay

## 2023-10-02 NOTE — Telephone Encounter (Signed)
  Follow up Call-     10/01/2023   11:02 AM  Call back number  Post procedure Call Back phone  # [phone]  Permission to leave phone message Yes     Patient questions:  Do you have a fever, pain , or abdominal swelling? No. Pain Score  0 *  Have you tolerated food without any problems? Yes.    Have you been able to return to your normal activities? Yes.    Do you have any questions about your discharge instructions: Diet   No. Medications  No. Follow up visit  No.  Do you have questions or concerns about your Care? No.  Actions: * If pain score is 4 or above: No action needed, pain <4.

## 2023-10-03 ENCOUNTER — Encounter: Payer: Self-pay | Admitting: Internal Medicine

## 2023-10-03 LAB — SURGICAL PATHOLOGY

## 2023-11-29 ENCOUNTER — Ambulatory Visit (HOSPITAL_BASED_OUTPATIENT_CLINIC_OR_DEPARTMENT_OTHER): Payer: 59 | Admitting: Obstetrics & Gynecology

## 2023-11-29 ENCOUNTER — Encounter (HOSPITAL_BASED_OUTPATIENT_CLINIC_OR_DEPARTMENT_OTHER): Payer: Self-pay | Admitting: Obstetrics & Gynecology

## 2023-11-29 ENCOUNTER — Other Ambulatory Visit (HOSPITAL_COMMUNITY)
Admission: RE | Admit: 2023-11-29 | Discharge: 2023-11-29 | Disposition: A | Discharge status: Home / Self Care | Source: Ambulatory Visit | Attending: Obstetrics & Gynecology | Admitting: Obstetrics & Gynecology

## 2023-11-29 VITALS — BP 129/71 | HR 79 | Ht 62.75 in | Wt 125.2 lb

## 2023-11-29 DIAGNOSIS — M810 Age-related osteoporosis without current pathological fracture: Secondary | ICD-10-CM

## 2023-11-29 DIAGNOSIS — Z01419 Encounter for gynecological examination (general) (routine) without abnormal findings: Secondary | ICD-10-CM

## 2023-11-29 DIAGNOSIS — Z124 Encounter for screening for malignant neoplasm of cervix: Secondary | ICD-10-CM | POA: Diagnosis present

## 2023-11-29 DIAGNOSIS — Z87410 Personal history of cervical dysplasia: Secondary | ICD-10-CM | POA: Diagnosis not present

## 2023-11-29 DIAGNOSIS — N870 Mild cervical dysplasia: Secondary | ICD-10-CM

## 2023-11-29 NOTE — Progress Notes (Addendum)
 ANNUAL EXAM Patient name: Dana Cook MRN [id number]  Date of birth: 05-15-1961 Chief Complaint:   Annual Exam  History of Present Illness:   Dana Cook is a 63 y.o. G0P0000 Caucasian female being seen today for a routine annual exam.  Doing well.  Denies vaginal bleeding.  Desires yearly pap smear.    Had a recent tick removal.  She is going to her PCP to see if blood work needed.  Doesn't want to do today.    Patient's last menstrual period was 12/10/2011 (approximate).   Last pap 09/18/2022. Results were: NILM w/ HRHPV negative. H/O abnormal pap: yes Last mammogram: 06/17/2023. Results were: normal. Family h/o breast cancer: no Last colonoscopy: 10/01/2023.  Follow up 5 years.       11/29/2023    9:22 AM 09/18/2022    2:03 PM 08/28/2021   10:25 AM 05/02/2021    1:30 PM 05/05/2018    1:16 PM  Depression screen PHQ 2/9  Decreased Interest 0 0 0 0 0  Down, Depressed, Hopeless 0 0 0 0 0  PHQ - 2 Score 0 0 0 0 0     Review of Systems:   Pertinent items are noted in HPI Denies any urinary or bowel changes.  Denies pelvic pain.   Pertinent History Reviewed:  Reviewed past medical,surgical, social and family history.  Reviewed problem list, medications and allergies. Physical Assessment:   Vitals:   11/29/23 0919  BP: 129/71  Pulse: 79  Weight: 125 lb 3.2 oz (56.8 kg)  Height: 5' 2.75 (1.594 m)  Body mass index is 22.36 kg/m.        Physical Examination:   General appearance - well appearing, and in no distress  Mental status - alert, oriented to person, place, and time  Psych:  She has a normal mood and affect  Skin - warm and dry, normal color, no suspicious lesions noted  Chest - effort normal, all lung fields clear to auscultation bilaterally  Heart - normal rate and regular rhythm  Neck:  midline trachea, no thyromegaly or nodules  Breasts - breasts appear normal, no suspicious masses, no skin or nipple changes or  axillary nodes  Abdomen - soft,  nontender, nondistended, no masses or organomegaly  Pelvic - VULVA: normal appearing vulva with no masses, tenderness or lesions   VAGINA: atrophic changes   CERVIX: normal appearing cervix without discharge or lesions, no CMT  Thin prep pap is   UTERUS: uterus is felt to be normal size, shape, consistency and nontender   ADNEXA: No adnexal masses or tenderness noted.  Rectal - normal rectal, good sphincter tone, no masses felt.   Extremities:  No swelling or varicosities noted  Chaperone present for exam  No results found for this or any previous visit (from the past 24 hours).  Assessment & Plan:  1. Well woman exam with routine gynecological exam - Pap smear neg 2024.  Pap obtained.  Negative HR - Mammogram 06/2023 - Colonoscopy 2025.  Follow up 5 years.   - Bone mineral density is due.  Will be ordered - lab work done with PCP, Zackary Heron - vaccines reviewed/updated  2. Age-related osteoporosis without current pathological fracture (Primary) - DG Bone Density; Future  3. Dysplasia of cervix, low grade (CIN 1) - hx of    Orders Placed This Encounter  Procedures   DG Bone Density    Meds: No orders of the defined types were placed in this encounter.  Follow-up: Return in about 1 year (around 11/28/2024).  Lillian Rein, MD 11/29/2023 10:16 AM

## 2023-12-04 LAB — CYTOLOGY - PAP: Diagnosis: NEGATIVE

## 2023-12-05 ENCOUNTER — Ambulatory Visit (HOSPITAL_BASED_OUTPATIENT_CLINIC_OR_DEPARTMENT_OTHER): Payer: Self-pay | Admitting: Certified Nurse Midwife

## 2023-12-05 DIAGNOSIS — M810 Age-related osteoporosis without current pathological fracture: Secondary | ICD-10-CM

## 2023-12-16 ENCOUNTER — Ambulatory Visit (HOSPITAL_COMMUNITY)
Admission: RE | Admit: 2023-12-16 | Discharge: 2023-12-16 | Disposition: A | Discharge status: Home / Self Care | Source: Ambulatory Visit | Attending: Obstetrics & Gynecology | Admitting: Obstetrics & Gynecology

## 2023-12-16 DIAGNOSIS — M810 Age-related osteoporosis without current pathological fracture: Secondary | ICD-10-CM | POA: Diagnosis present

## 2023-12-29 ENCOUNTER — Encounter (HOSPITAL_BASED_OUTPATIENT_CLINIC_OR_DEPARTMENT_OTHER): Payer: Self-pay | Admitting: Obstetrics & Gynecology

## 2023-12-30 ENCOUNTER — Telehealth (HOSPITAL_BASED_OUTPATIENT_CLINIC_OR_DEPARTMENT_OTHER): Payer: Self-pay | Admitting: Obstetrics & Gynecology

## 2023-12-30 MED ORDER — ALENDRONATE SODIUM 70 MG PO TABS: 70.0000 mg | ORAL_TABLET | ORAL | 3 refills | Status: AC

## 2023-12-30 NOTE — Telephone Encounter (Signed)
 Called pt in response to my chart message. She has done some research and would like to start fosamax .  Dosing discussed.  Weekly administration.  Taking with water, on empty stomach, and being upright without anything else for 30 minutes discussed.  Repeat BMD recommended in 2 years.  Questions answered.  Rx sent to requested pharmacy.

## 2024-03-25 ENCOUNTER — Encounter (INDEPENDENT_AMBULATORY_CARE_PROVIDER_SITE_OTHER): Payer: Self-pay | Admitting: Gastroenterology

## 2024-06-16 ENCOUNTER — Other Ambulatory Visit (HOSPITAL_COMMUNITY): Payer: Self-pay | Admitting: Nurse Practitioner

## 2024-06-16 DIAGNOSIS — Z1231 Encounter for screening mammogram for malignant neoplasm of breast: Secondary | ICD-10-CM

## 2024-06-19 ENCOUNTER — Encounter (HOSPITAL_COMMUNITY): Payer: Self-pay

## 2024-06-19 ENCOUNTER — Ambulatory Visit (HOSPITAL_COMMUNITY): Admission: RE | Admit: 2024-06-19 | Discharge: 2024-06-19 | Disposition: A | Source: Ambulatory Visit

## 2024-06-19 DIAGNOSIS — Z1231 Encounter for screening mammogram for malignant neoplasm of breast: Secondary | ICD-10-CM | POA: Insufficient documentation

## 2024-12-04 ENCOUNTER — Ambulatory Visit (HOSPITAL_BASED_OUTPATIENT_CLINIC_OR_DEPARTMENT_OTHER): Admitting: Obstetrics & Gynecology
